# Patient Record
Sex: Female | Born: 1935 | Race: White | Hispanic: No | Smoking: Never smoker
Health system: Southern US, Community
[De-identification: ages and names within clinical notes are randomized; demographics above are authoritative.]

## PROBLEM LIST (undated history)

## (undated) DIAGNOSIS — I10 Essential (primary) hypertension: Secondary | ICD-10-CM

## (undated) DIAGNOSIS — Z5189 Encounter for other specified aftercare: Secondary | ICD-10-CM

## (undated) DIAGNOSIS — E079 Disorder of thyroid, unspecified: Secondary | ICD-10-CM

## (undated) DIAGNOSIS — I4891 Unspecified atrial fibrillation: Secondary | ICD-10-CM

## (undated) HISTORY — DX: Essential (primary) hypertension: I10

## (undated) HISTORY — DX: Disorder of thyroid, unspecified: E07.9

## (undated) HISTORY — PX: CHOLECYSTECTOMY: SHX55

## (undated) HISTORY — DX: Unspecified atrial fibrillation: I48.91

---

## 1998-11-12 ENCOUNTER — Ambulatory Visit (HOSPITAL_COMMUNITY): Admission: RE | Admit: 1998-11-12 | Discharge: 1998-11-12 | Payer: Self-pay | Admitting: Cardiology

## 1998-11-12 ENCOUNTER — Encounter: Payer: Self-pay | Admitting: Cardiology

## 1999-09-22 ENCOUNTER — Encounter (INDEPENDENT_AMBULATORY_CARE_PROVIDER_SITE_OTHER): Payer: Self-pay | Admitting: Specialist

## 1999-09-22 ENCOUNTER — Ambulatory Visit (HOSPITAL_COMMUNITY): Admission: RE | Admit: 1999-09-22 | Discharge: 1999-09-22 | Payer: Self-pay | Admitting: *Deleted

## 2000-02-06 ENCOUNTER — Emergency Department (HOSPITAL_COMMUNITY): Admission: EM | Admit: 2000-02-06 | Discharge: 2000-02-06 | Payer: Self-pay | Admitting: Emergency Medicine

## 2003-01-03 ENCOUNTER — Ambulatory Visit (HOSPITAL_COMMUNITY): Admission: RE | Admit: 2003-01-03 | Discharge: 2003-01-03 | Payer: Self-pay | Admitting: Gastroenterology

## 2004-09-29 ENCOUNTER — Inpatient Hospital Stay (HOSPITAL_COMMUNITY): Admission: AD | Admit: 2004-09-29 | Discharge: 2004-09-30 | Payer: Self-pay | Admitting: Internal Medicine

## 2004-09-30 ENCOUNTER — Encounter (INDEPENDENT_AMBULATORY_CARE_PROVIDER_SITE_OTHER): Payer: Self-pay | Admitting: Specialist

## 2005-02-15 ENCOUNTER — Encounter: Admission: RE | Admit: 2005-02-15 | Discharge: 2005-02-15 | Payer: Self-pay | Admitting: Cardiology

## 2005-06-14 ENCOUNTER — Encounter (HOSPITAL_COMMUNITY): Admission: RE | Admit: 2005-06-14 | Discharge: 2005-09-12 | Payer: Self-pay | Admitting: Cardiology

## 2005-07-05 ENCOUNTER — Ambulatory Visit: Payer: Self-pay | Admitting: Hematology & Oncology

## 2005-08-24 ENCOUNTER — Ambulatory Visit: Payer: Self-pay | Admitting: Hematology & Oncology

## 2005-11-28 ENCOUNTER — Ambulatory Visit: Payer: Self-pay | Admitting: Hematology & Oncology

## 2005-11-30 LAB — CBC & DIFF AND RETIC
BASO%: 0.5 % (ref 0.0–2.0)
EOS%: 0.1 % (ref 0.0–7.0)
HCT: 36.5 % (ref 34.8–46.6)
LYMPH%: 23.7 % (ref 14.0–48.0)
MCH: 32.3 pg (ref 26.0–34.0)
MCHC: 34.4 g/dL (ref 32.0–36.0)
MONO%: 9.6 % (ref 0.0–13.0)
NEUT%: 66.1 % (ref 39.6–76.8)
Platelets: 252 10*3/uL (ref 145–400)

## 2006-03-20 ENCOUNTER — Ambulatory Visit: Payer: Self-pay | Admitting: Hematology & Oncology

## 2006-03-22 LAB — CBC WITH DIFFERENTIAL/PLATELET
Basophils Absolute: 0 10*3/uL (ref 0.0–0.1)
EOS%: 0 % (ref 0.0–7.0)
Eosinophils Absolute: 0 10*3/uL (ref 0.0–0.5)
HGB: 13.2 g/dL (ref 11.6–15.9)
LYMPH%: 23.5 % (ref 14.0–48.0)
MCH: 32.4 pg (ref 26.0–34.0)
MCV: 95.1 fL (ref 81.0–101.0)
MONO%: 10.3 % (ref 0.0–13.0)
NEUT#: 5.4 10*3/uL (ref 1.5–6.5)
Platelets: 338 10*3/uL (ref 145–400)
RBC: 4.08 10*6/uL (ref 3.70–5.32)

## 2006-09-15 ENCOUNTER — Ambulatory Visit: Payer: Self-pay | Admitting: Hematology & Oncology

## 2006-09-20 LAB — CBC WITH DIFFERENTIAL/PLATELET
BASO%: 0.6 % (ref 0.0–2.0)
EOS%: 0.2 % (ref 0.0–7.0)
Eosinophils Absolute: 0 10*3/uL (ref 0.0–0.5)
LYMPH%: 30.8 % (ref 14.0–48.0)
MCH: 30.9 pg (ref 26.0–34.0)
MCHC: 34.4 g/dL (ref 32.0–36.0)
MCV: 89.7 fL (ref 81.0–101.0)
MONO%: 7.7 % (ref 0.0–13.0)
Platelets: 289 10*3/uL (ref 145–400)
RBC: 4.08 10*6/uL (ref 3.70–5.32)

## 2006-09-20 LAB — FERRITIN: Ferritin: 380 ng/mL — ABNORMAL HIGH (ref 10–291)

## 2007-03-08 ENCOUNTER — Ambulatory Visit: Payer: Self-pay | Admitting: Hematology & Oncology

## 2007-03-12 LAB — CBC WITH DIFFERENTIAL/PLATELET
BASO%: 0.9 % (ref 0.0–2.0)
EOS%: 0.1 % (ref 0.0–7.0)
HCT: 36.8 % (ref 34.8–46.6)
MCH: 32.8 pg (ref 26.0–34.0)
MCHC: 36 g/dL (ref 32.0–36.0)
NEUT%: 54.5 % (ref 39.6–76.8)
RBC: 4.03 10*6/uL (ref 3.70–5.32)
lymph#: 2.1 10*3/uL (ref 0.9–3.3)

## 2007-09-06 ENCOUNTER — Ambulatory Visit: Payer: Self-pay | Admitting: Hematology & Oncology

## 2007-11-09 ENCOUNTER — Encounter: Admission: RE | Admit: 2007-11-09 | Discharge: 2007-11-09 | Payer: Self-pay | Admitting: Internal Medicine

## 2010-10-22 NOTE — Op Note (Signed)
NAMEDENYLA, CORTESE NO.:  [id number]   MEDICAL RECORD NO.:  [id number]          PATIENT TYPE:  INP   LOCATION:  4731                         FACILITY:  [facility]   PHYSICIAN:  Georgiana Spinner, M.D.    DATE OF BIRTH:  01-02-1916   DATE OF PROCEDURE:  DATE OF DISCHARGE:                                 OPERATIVE REPORT   PROCEDURE:  Upper endoscopy.   INDICATIONS:  GI bleeding.   ANESTHESIA:  Demerol 50, Versed 5 mg.   PROCEDURE:  With the patient mildly sedated in the left lateral decubitus  position, the Olympus videoscopic endoscope was inserted and passed under  passed under direct vision through the esophagus which appeared normal.  The  fundus, body, and antrum, duodenal bulb, and second portion fo the duodenum  appeared normal.  From this point, the endoscope was slowly withdrawn taking  circumferential views of the duodenum.  The endoscope was then pulled back  and retroflexed taking circumferential views __________ .  Multiple erosions  were seen at the level of the hiatus.  These were photographed  A biopsy was  taken.  There were also multiple small erosions of the duodenal bulb which  were photographed only.   IMPRESSION:  No active bleeding at this point.  I did a rectal examination  which revealed that the stool was now dark brown. __________   PLAN:  Will get a __________  make sure her hemoglobin and hematocrit  remains stable.  She can be discharged with outpatient followup.      GMO/MEDQ  D:  09/30/2004  T:  09/30/2004  Job:  [id number]

## 2010-10-22 NOTE — H&P (Signed)
Cheryl Larson, Cheryl Larson NO.:  [id number]   MEDICAL RECORD NO.:  [id number]          PATIENT TYPE:  INP   LOCATION:  4731                         FACILITY:  [facility]   PHYSICIAN:  Isidor Holts, M.D.  DATE OF BIRTH:  01/01/1916   DATE OF ADMISSION:  09/29/2004  DATE OF DISCHARGE:                                HISTORY & PHYSICAL   PRIMARY CARE PHYSICIAN:  Lucas Mallow. MD.   GASTROENTEROLOGY:  Georgiana Spinner, M.D.   CHIEF COMPLAINT:  Increasing weakness/tiredness/anemia.   HISTORY OF PRESENT ILLNESS:  This is an 77 year old female, with a known  history of paroxysmal atrial fibrillation on anticoagulation therapy.  She  also has a background history of hypertension, GERD, dysthyroidism, and  dyslipidemia.  She states that she has been feeling increasingly weak,  tired, and easily fatigued over the last 1 month.  Has also had mild  shortness of breath on exertion.  Denies chest pain or ankle swelling.  Has  become increasingly light-headed intermittently, but has had no falls or  loss of consciousness.  She called her PMD on September 28, 2004, and was seen  by the P.A. on September 29, 2004.  Her hemoglobin was reportedly low (i.e.  8.8), and the patient was referred for admission.  According to the patient,  she has been having black bowel movements for approximately one month now,  but stool guaiac testing done in her PMD's office on September 29, 2004 was  negative.  The patient has had regular colonoscopies in the past.  The last  one was 2-3 years ago.   PAST MEDICAL HISTORY:  1.  Paroxysmal atrial fibrillation.  2.  Hypertension.  3.  Dyslipidemia.  4.  GERD.  5.  Hypothyroidism.  6.  Status post tonsillectomy/status post cholecystectomy/status post      cystocele and rectocele repair years ago.   MEDICATION HISTORY:  1.  Propranolol 40 mg p.o. b.i.d.  2.  Lisinopril 10 mg p.o. daily.  3.  Synthroid 88 mcg p.o. daily.  4.  Lipitor 10 mg p.o. daily.  5.  HCTZ 12.5 mg  p.o. daily.  6.  Protonix 40 mg p.o. daily.  7.  Coumadin 4 mg p.o. daily.  8.  Vicodin 5/500 one p.o. t.i.d.  9.  Vitamin C/Vitamin E/calcium.   ALLERGIES:  SULFA.   FAMILY AND SOCIAL HISTORY:  This patient is retired.  She used to work in  the Oceanographer at [employer].  Nonsmoker, nondrinker.  Has no history  of drug abuse.  She has one daughter who is status post thyroidectomy for  thyroid CA, and one adopted son.  She had two brothers, but they are now  deceased; one status post myocardial infarction, the other status post  multiple CVAs.  Her father was an alcoholic.  Her mother had emphysema,  apparently secondary to passive smoking.   PHYSICAL EXAMINATION:  VITAL SIGNS:  Temperature 97.5, pulse 64 per minute  and regular, respiratory rate 20, blood pressure 105/61 mmHg.  Pulse  oximetry 98% on room air.  GENERAL:  The patient appears quite comfortable, alert, communicative.  Not  short of breath at rest.  HEENT:  The patient is clinically pale, not jaundiced.  There is no  conjunctival injection, and throat appears quite clear.  NECK:  Supple.  JVP not seen.  There is no palpable lymphadenopathy.  CHEST:  Clear to auscultation.  No wheezes, no crackles.  HEART:  Heart sounds 1 and 2 heard.  No murmurs.  ABDOMEN:  Soft and nontender.  There is no palpable organomegaly.  No  palpable masses.  Normal bowel sounds.  LOWER EXTREMITY EXAMINATION:  No pitting edema.  Palpable peripheral pulses.  MUSCULOSKELETAL:  Not formally examined.  CENTRAL NERVOUS SYSTEM:  No focal neurologic deficit on gross examination.   INVESTIGATIONS:  INR 3.4, according to PMD.  Hemoglobin is 8.8.  Other labs  are still pending.   ASSESSMENT AND PLAN:  1.  Moderate anemia, query secondary to chronic gastrointestinal blood loss      against the background of anticoagulation.  As the patient is      symptomatic, will do iron, ferritin, TIBC, B-12, and folate levels.      Following that, will  transfuse with 2 units packed red blood cells.  We      shall also do stool guaiac testing x3, and monitor CBC.   1.  Increasing weakness/fatigue, likely secondary to #1 above.  The patient      should improve with blood transfusion.  We shall follow posttransfusion      CBC.  She will likely get a work-up, possibly as an outpatient, but will      consult Dr. Virginia Rochester, as he has been her gastroenterologist.   1.  Paroxysmal atrial fibrillation, now in sinus rhythm.  The rate is      controlled on propranolol and on Coumadin.  In view of possible GI blood      loss, will discontinue Coumadin, and follow PT INR.   1.  Hypertension, controlled.  Continue pre-admission medications, but      reduce Lisinopril to 5 mg p.o. daily as systolic blood pressure is low      normal.   1.  Dysthyroidism.  Continue preadmission Synthroid dosages and check TSH.   1.  Gastroesphageal reflux disease. Continue PPI.   1.  Dyslipidemia, on statin.  Will continue.   Further management will dependent on clinical course.      CO/MEDQ  D:  09/29/2004  T:  09/29/2004  Job:  [id number]   cc:   Georgiana Spinner, M.D.  [address]  [place]  [place] [address]  Fax: [phone]

## 2011-11-03 ENCOUNTER — Telehealth: Payer: Self-pay | Admitting: Hematology & Oncology

## 2011-11-03 ENCOUNTER — Telehealth: Payer: Self-pay | Admitting: *Deleted

## 2011-11-03 NOTE — Telephone Encounter (Signed)
Pt aware of 5-31 appointment and Tammy at referring is also aware

## 2011-11-03 NOTE — Telephone Encounter (Signed)
New Patient referral received from Dr Benedetto Goad for [facility], needing IV iron. Patient previous patient with Dr Myna Hidalgo, informed daughter, Emelda Brothers that Dr Myna Hidalgo is now at [facility] office, and she said that is fine, she would still prefer to travel to see Dr Myna Hidalgo, "she really liked him". Informed daughter that Raiford Noble will be calling to re-establish patient.  Faxed referral to satellite office, [phone].

## 2011-11-04 ENCOUNTER — Ambulatory Visit (HOSPITAL_BASED_OUTPATIENT_CLINIC_OR_DEPARTMENT_OTHER): Payer: Medicare Other

## 2011-11-04 ENCOUNTER — Other Ambulatory Visit (HOSPITAL_BASED_OUTPATIENT_CLINIC_OR_DEPARTMENT_OTHER): Payer: Medicare Other | Admitting: Lab

## 2011-11-04 ENCOUNTER — Other Ambulatory Visit: Payer: Self-pay

## 2011-11-04 ENCOUNTER — Ambulatory Visit: Payer: Self-pay

## 2011-11-04 ENCOUNTER — Ambulatory Visit (HOSPITAL_BASED_OUTPATIENT_CLINIC_OR_DEPARTMENT_OTHER): Payer: Medicare Other | Admitting: Hematology & Oncology

## 2011-11-04 ENCOUNTER — Ambulatory Visit: Payer: Medicare Other

## 2011-11-04 VITALS — BP 111/67 | HR 58 | Temp 96.9°F | Ht 64.0 in | Wt 147.0 lb

## 2011-11-04 DIAGNOSIS — D509 Iron deficiency anemia, unspecified: Secondary | ICD-10-CM

## 2011-11-04 LAB — CBC WITH DIFFERENTIAL (CANCER CENTER ONLY)
BASO%: 0.2 % (ref 0.0–2.0)
EOS%: 0.2 % (ref 0.0–7.0)
MCH: 28.2 pg (ref 26.0–34.0)
MCHC: 31.5 g/dL — ABNORMAL LOW (ref 32.0–36.0)
MONO%: 11.3 % (ref 0.0–13.0)
NEUT#: 2.5 10*3/uL (ref 1.5–6.5)
Platelets: 240 10*3/uL (ref 145–400)
RBC: 3.55 10*6/uL — ABNORMAL LOW (ref 3.70–5.32)
RDW: 13.7 % (ref 11.1–15.7)

## 2011-11-04 MED ORDER — SODIUM CHLORIDE 0.9 % IV SOLN
Freq: Once | INTRAVENOUS | Status: AC
  Administered 2011-11-04: 13:00:00 via INTRAVENOUS

## 2011-11-04 MED ORDER — SODIUM CHLORIDE 0.9 % IV SOLN
1020.0000 mg | Freq: Once | INTRAVENOUS | Status: AC
  Administered 2011-11-04: 1020 mg via INTRAVENOUS
  Filled 2011-11-04: qty 34

## 2011-11-04 NOTE — Progress Notes (Signed)
CC:   Cheryl Larson. Andrey Campanile, M.D.  DIAGNOSIS:  Anemia, iron deficiency.  HISTORY OF PRESENT ILLNESS:  Cheryl Larson is a very nice 78 year old white female.  I have not seen her now for about 5 years.  I initially saw her back in 2007/2008.  At that point in time, she was iron deficient.  We did go ahead and give her a dose of IV iron.  This obviously worked quite well for the patient.  She said it did make her feel better.  She is followed Dr. Benedetto Goad.  Over the past month or so, she has felt more tired.  She has had no obvious bleeding.  She said that Dr. Andrey Campanile did check her stools for blood.  She did do some stool guaiac cards.  She does not know the results.  She has not noticed any melena or bright red blood per rectum.  She did have lab work done by Dr. Andrey Campanile.  This was just a few weeks ago.  She was found to have normal B12 level of 247.  She had normal thyroid test.  She did have a CBC done back on 05/17 that showed a white cell count of 5, hemoglobin 9.4, hematocrit 29.8, platelet count was 245.  MCV was 93.  Ferritin was only 8.  Iron studies showed iron saturation of only 6%.  TIBC was 511.  Dr. Andrey Campanile kindly referred Cheryl Larson back to the [facility] for another dose of IV iron.  Again, she got iron back in 2007.  I think she probably got a dose of about 1800 mg.  She responded nicely to this.  The last time that we saw her, her hemoglobin was 13.3, hematocrit was 36.8.  Her ferritin.  I think was about 300.  Again, she has not had any cravings.  She is not chewing ice.  There is no dysphasia, odynophagia.  There is no melena.  She had no nausea, vomiting.  She is not a vegetarian.  There has been no weight loss or weight gain.  She has had no real change in her medications.  Her daughter wants her to get off the Zocor.  I do not see any problems with her going off Zocor at her age.  PAST MEDICAL HISTORY:  Remarkable for: 1. Iron deficiency anemia. 2.  Hyperlipidemia. 3. Hypertension. 4. Hypothyroidism. 5. Atrial fibrillation on Coumadin.  ALLERGIES:  Sulfa.  MEDICATIONS: 1. Hydrochlorothiazide 12.5 mg p.o. daily. 2. Synthroid 0.088 mg p.o. daily. 3. Inderal 60 mg p.o. b.i.d. 4. Zocor 20 mg p.o. daily. 5. Warfarin 4 mg p.o. daily.  SOCIAL HISTORY:  Negative for tobacco or alcohol use.  She has no occupational exposures.  FAMILY HISTORY:  I think noncontributory.  There is no history of "anemia" in her family.  There is very little history of cancer in the family.  REVIEW OF SYSTEMS:  As stated in history of present illness.  No additional findings noted on a 12 system review.  PHYSICAL EXAMINATION:  General:  This is an elderly but fairly well- nourished white female in no obvious distress.  Vital signs:  Showed temperature of 96.9, pulse 58, respiratory rate 20, blood pressure 111/67.  Weight is 147.  Head and neck:  Exam shows a normocephalic, atraumatic skull.  There are no ocular or oral lesions.  She has no scleral icterus.  Oral mucosa is moist.  Neck:  There is no adenopathy in her neck.  Thyroid is nonpalpable.  Lungs:  Clear to percussion and auscultation bilaterally.  She has an occasional dry crackle at the right base.  Cardiac:  Regular rate and rhythm consistent with atrial fibrillation.  She has a 1/6 systolic ejection murmur.  Abdomen:  Soft with good bowel sounds.  There is no palpable abdominal mass.  There is no fluid wave.  There is no palpable hepatosplenomegaly.  Back:  No tenderness over the spine, ribs or hips.  Extremities:  Show no clubbing, cyanosis or edema.  Neurological:  Shows no focal neurological deficits.  Skin:  Exam shows no rashes, ecchymoses or petechiae.  LABORATORY STUDIES:  White cell count is 4.8, hemoglobin 10, hematocrit 31.7, platelet count 243.  MCV of 89.  Peripheral smear shows some mild anisocytosis and poikilocytosis.  There are no target cells.  I see no nucleated red  blood cells.  There are some microcytic red cells.  I see a couple of hypochromic red cells.  She has no rouleaux formation. White cells appear normal in morphology and maturation.  There are no immature myeloid or lymphoid forms.  There are no atypical lymphocytes. I see no hypersegmented polys.  Platelets are adequate in number and size.  Platelets are well granulated.  IMPRESSION:  Cheryl Larson is a 78 year old white female with iron deficiency anemia.  By the numbers that were received by Dr. Benedetto Goad, she is iron deficient.  As such, we will go ahead and give her a dose of IV iron today.  IV Feraheme will certainly be amenable to her as this will only take about an hour.  I really do not think that a big workup needs to be undertaken for this iron deficiency.  I believe that if her stools are heme positive then she probably will need to have a colonoscopy.  I do realize that she is on Coumadin but yet there needs to be a reason for her to have GI blood loss.  I do want to get her back to see me in another 4-6 weeks.  By then the iron that we gave her should be making her feel better.  I spent a good hour with Cheryl Larson.  It was nice to see her again.  I had a good time with her and her daughter.  Cheryl Larson is very funny.    ______________________________ Josph Macho, M.D. PRE/MEDQ  D:  11/04/2011  T:  11/04/2011  Job:  [id number]

## 2011-11-04 NOTE — Patient Instructions (Signed)
Ferumoxytol injection What is this medicine? FERUMOXYTOL is an iron complex. Iron is used to make healthy red blood cells, which carry oxygen and nutrients throughout the body. This medicine is used to treat iron deficiency anemia in people with chronic kidney disease. This medicine may be used for other purposes; ask your health care provider or pharmacist if you have questions. What should I tell my health care provider before I take this medicine? They need to know if you have any of these conditions: -anemia not caused by low iron levels -high levels of iron in the blood -magnetic resonance imaging (MRI) test scheduled -an unusual or allergic reaction to iron, other medicines, foods, dyes, or preservatives -pregnant or trying to get pregnant -breast-feeding How should I use this medicine? This medicine is for infusion into a vein. It is given by a health care professional in a hospital or clinic setting. Talk to your pediatrician regarding the use of this medicine in children. Special care may be needed. Overdosage: If you think you've taken too much of this medicine contact a poison control center or emergency room at once. Overdosage: If you think you have taken too much of this medicine contact a poison control center or emergency room at once. NOTE: This medicine is only for you. Do not share this medicine with others. What if I miss a dose? It is important not to miss your dose. Call your doctor or health care professional if you are unable to keep an appointment. What may interact with this medicine? This medicine may interact with the following medications: -other iron products This list may not describe all possible interactions. Give your health care provider a list of all the medicines, herbs, non-prescription drugs, or dietary supplements you use. Also tell them if you smoke, drink alcohol, or use illegal drugs. Some items may interact with your medicine. What should I watch  for while using this medicine? Visit your doctor or healthcare professional regularly. Tell your doctor or healthcare professional if your symptoms do not start to get better or if they get worse. You may need blood work done while you are taking this medicine. You may need to follow a special diet. Talk to your doctor. Foods that contain iron include: whole grains/cereals, dried fruits, beans, or peas, leafy green vegetables, and organ meats (liver, kidney). What side effects may I notice from receiving this medicine? Side effects that you should report to your doctor or health care professional as soon as possible: -allergic reactions like skin rash, itching or hives, swelling of the face, lips, or tongue -breathing problems -changes in blood pressure -feeling faint or lightheaded, falls -fever or chills -flushing, sweating, or hot feelings -swelling of the ankles or feet Side effects that usually do not require medical attention (Report these to your doctor or health care professional if they continue or are bothersome.): -diarrhea -headache -nausea, vomiting -stomach pain This list may not describe all possible side effects. Call your doctor for medical advice about side effects. You may report side effects to FDA at 1-800-FDA-1088. Where should I keep my medicine? This drug is given in a hospital or clinic and will not be stored at home. NOTE: This sheet is a summary. It may not cover all possible information. If you have questions about this medicine, talk to your doctor, pharmacist, or health care provider.  2012, Elsevier/Gold Standard. (02/13/2008 9:48:25 PM) 

## 2011-11-04 NOTE — Progress Notes (Signed)
This office note has been dictated.

## 2011-11-05 LAB — IRON AND TIBC
%SAT: 11 % — ABNORMAL LOW (ref 20–55)
Iron: 53 ug/dL (ref 42–145)
TIBC: 476 ug/dL — ABNORMAL HIGH (ref 250–470)
UIBC: 423 ug/dL — ABNORMAL HIGH (ref 125–400)

## 2011-11-05 LAB — FERRITIN: Ferritin: 9 ng/mL — ABNORMAL LOW (ref 10–291)

## 2011-11-05 LAB — RETICULOCYTES (CHCC): ABS Retic: 65.5 10*3/uL (ref 19.0–186.0)

## 2011-12-09 ENCOUNTER — Ambulatory Visit (HOSPITAL_BASED_OUTPATIENT_CLINIC_OR_DEPARTMENT_OTHER): Payer: Medicare Other | Admitting: Hematology & Oncology

## 2011-12-09 ENCOUNTER — Other Ambulatory Visit (HOSPITAL_BASED_OUTPATIENT_CLINIC_OR_DEPARTMENT_OTHER): Payer: Medicare Other | Admitting: Lab

## 2011-12-09 VITALS — BP 153/79 | HR 86 | Temp 97.2°F | Ht 64.0 in | Wt 145.0 lb

## 2011-12-09 DIAGNOSIS — D509 Iron deficiency anemia, unspecified: Secondary | ICD-10-CM

## 2011-12-09 DIAGNOSIS — R197 Diarrhea, unspecified: Secondary | ICD-10-CM

## 2011-12-09 LAB — CBC WITH DIFFERENTIAL (CANCER CENTER ONLY)
BASO#: 0 10*3/uL (ref 0.0–0.2)
EOS%: 0 % (ref 0.0–7.0)
Eosinophils Absolute: 0 10*3/uL (ref 0.0–0.5)
HGB: 12.1 g/dL (ref 11.6–15.9)
LYMPH#: 1.8 10*3/uL (ref 0.9–3.3)
MCHC: 33.3 g/dL (ref 32.0–36.0)
MONO%: 9.1 % (ref 0.0–13.0)
NEUT#: 2.8 10*3/uL (ref 1.5–6.5)
Platelets: 202 10*3/uL (ref 145–400)
RBC: 3.93 10*6/uL (ref 3.70–5.32)

## 2011-12-09 LAB — CHCC SATELLITE - SMEAR

## 2011-12-09 LAB — IRON AND TIBC
Iron: 58 ug/dL (ref 42–145)
TIBC: 297 ug/dL (ref 250–470)
UIBC: 239 ug/dL (ref 125–400)

## 2011-12-09 NOTE — Progress Notes (Signed)
This office note has been dictated.

## 2011-12-09 NOTE — Progress Notes (Signed)
CC:   Gloriajean Dell. Andrey Campanile, M.D.  DIAGNOSIS:  Iron-deficiency anemia.  CURRENT THERAPY:  the patient is status post IV iron on 10/05/2011 with Feraheme.  INTERIM HISTORY:  Ms. Cheryl Larson comes in for followup.  She clearly was iron- deficient when we saw her.  Her iron studies showed a ferritin of only 9.  Iron saturation was 11%.  We did give her a dose of Feraheme 1020 mg on May 31st.  This has helped her quite a bit.  She is also off her Zocor.  She is not having as much diarrhea right now.  She has had no obvious bleeding.  There is no melena or bright red blood per rectum.  She has had no headache.  She has had some osteoporotic issues.  PHYSICAL EXAMINATION:  This is an elderly white female in no obvious distress.  Vital Signs:  972, pulse 86, respiratory rate 22, blood pressure 153/79.  Weight is 145.  Head and neck:  Normocephalic, atraumatic skull.  There are no ocular or oral lesions.  There are no palpable cervical or supraclavicular lymph nodes.  Lungs:  Clear bilaterally.  Cardiac:  Regular rate and rhythm with a normal S1 and S2. There are no murmurs, rubs or bruits.  Abdomen:  Soft with good bowel sounds.  There is no palpable abdominal mass.  There is no fluid wave. There is no palpable hepatosplenomegaly:  Back:  Some kyphosis.  She has no tenderness over the spine, ribs, or hips.  Extremities:  No clubbing, cyanosis or edema.  Neurological:  No focal neurological deficits.  LABORATORY STUDIES:  White cell count 5.1, hemoglobin 12.1, hematocrit 36.3, platelet count is 202.  MCV is 92.  IMPRESSION:  Ms. Cheryl Larson is a 78 year old white female with iron- deficiency anemia.  She is doing much better now.  The iron has helped her out.  We will plan to get her back in about 2- or 3 months now.  Hopefully, she will be able to hold onto her iron.    ______________________________ Josph Macho, M.D. PRE/MEDQ  D:  12/09/2011  T:  12/09/2011  Job:  [id number]

## 2011-12-15 ENCOUNTER — Telehealth: Payer: Self-pay | Admitting: Oncology

## 2011-12-15 NOTE — Telephone Encounter (Addendum)
Message copied by Lacie Draft on Thu Dec 15, 2011  9:02 AM ------      Message from: Arlan Organ R      Created: Wed Dec 14, 2011  6:18 PM       Please call and tell her that her iron is much better. Thank you. Pete 9:04 AM 12/15/2011 Called and left msg on patient's phone. Teola Bradley, Oluwatosin Bracy [employer]

## 2012-01-18 ENCOUNTER — Other Ambulatory Visit (HOSPITAL_COMMUNITY): Payer: Self-pay | Admitting: Nephrology

## 2012-01-18 DIAGNOSIS — N289 Disorder of kidney and ureter, unspecified: Secondary | ICD-10-CM

## 2012-01-20 ENCOUNTER — Ambulatory Visit (HOSPITAL_COMMUNITY)
Admission: RE | Admit: 2012-01-20 | Discharge: 2012-01-20 | Disposition: A | Discharge status: Home / Self Care | Payer: Medicare Other | Source: Ambulatory Visit | Attending: Nephrology | Admitting: Nephrology

## 2012-01-20 DIAGNOSIS — N189 Chronic kidney disease, unspecified: Secondary | ICD-10-CM | POA: Insufficient documentation

## 2012-01-20 DIAGNOSIS — N289 Disorder of kidney and ureter, unspecified: Secondary | ICD-10-CM

## 2012-03-05 ENCOUNTER — Other Ambulatory Visit (HOSPITAL_BASED_OUTPATIENT_CLINIC_OR_DEPARTMENT_OTHER): Payer: Medicare Other | Admitting: Lab

## 2012-03-05 ENCOUNTER — Ambulatory Visit (HOSPITAL_BASED_OUTPATIENT_CLINIC_OR_DEPARTMENT_OTHER): Payer: Medicare Other | Admitting: Medical

## 2012-03-05 VITALS — BP 106/65 | HR 65 | Temp 98.0°F | Resp 16 | Ht 64.0 in | Wt 145.0 lb

## 2012-03-05 DIAGNOSIS — D509 Iron deficiency anemia, unspecified: Secondary | ICD-10-CM

## 2012-03-05 LAB — CBC WITH DIFFERENTIAL (CANCER CENTER ONLY)
BASO#: 0 10*3/uL (ref 0.0–0.2)
BASO%: 0.5 % (ref 0.0–2.0)
EOS%: 0 % (ref 0.0–7.0)
HCT: 35.9 % (ref 34.8–46.6)
HGB: 12.2 g/dL (ref 11.6–15.9)
LYMPH#: 1.4 10*3/uL (ref 0.9–3.3)
LYMPH%: 34.2 % (ref 14.0–48.0)
MCH: 32.4 pg (ref 26.0–34.0)
MCHC: 34 g/dL (ref 32.0–36.0)
MONO%: 11.9 % (ref 0.0–13.0)
NEUT%: 53.4 % (ref 39.6–80.0)
RDW: 14.4 % (ref 11.1–15.7)

## 2012-03-05 LAB — IRON AND TIBC: UIBC: 253 ug/dL (ref 125–400)

## 2012-03-05 NOTE — Progress Notes (Signed)
Diagnosis: Iron deficiency anemia.  Current therapy: IV iron as indicated (last given on 11/04/2011, 1,020mg  IV Feraheme)  Interim history: Cheryl Larson presents today for an office followup visit.  Her daughter accompanies her.  Overall, Cheryl Larson, reports, that she's doing remarkably well.  She still continues to be quite active and drives.  Her self around.  Her hemoglobin looks good today at 12.2.  The last, time, she received IV iron was back on 11/04/2011.  She had a ferritin of 9, with 11% saturation.  She reports, she has fairly good energy.  She has a good appetite.  She denies any nausea, vomiting, diarrhea, or constipation.  She denies any obvious, bleeding.  There is no melena or bright red blood per thumb.  She denies any cough, chest pain, or shortness of breath.  She denies any fevers, chills, or night sweats.  She denies any headache, visual changes.  Any rashes.  Overall, for 78 years old, she does remarkably well.  Review of Systems: Pt. Denies any changes in their vision, hearing, adenopathy, fevers, chills, nausea, vomiting, diarrhea, constipation, chest pain, shortness of breath, passing blood, passing out, blacking out,  any changes in skin, joints, neurologic or psychiatric except as noted.  Physical Exam: This is a pleasant, 78 year old, elderly, white female, in no obvious distress Vitals: Temperature 90.0 degrees, pulse 65, respirations 16, blood pressure 106/65.  Weight 145 pounds HEENT reveals a normocephalic, atraumatic skull, no scleral icterus, no oral lesions  Neck is supple without any cervical or supraclavicular adenopathy.  Lungs are clear to auscultation bilaterally. There are no wheezes, rales or rhonci Cardiac is regular rate and rhythm with a normal S1 and S2. There are no murmurs, rubs, or bruits.  Abdomen is soft with good bowel sounds, there is no palpable mass. There is no palpable hepatosplenomegaly. There is no palpable fluid wave.  Musculoskeletal no  tenderness of the spine, ribs, or hips.  Extremities there are no clubbing, cyanosis, or edema.  Skin no petechia, purpura or ecchymosis Neurologic is nonfocal.  Laboratory Data: White count 4.0, hemoglobin 12.2, hematocrit 35.9, platelets 199,000  Current Outpatient Prescriptions on File Prior to Visit  Medication Sig Dispense Refill  . hydrochlorothiazide (MICROZIDE) 12.5 MG capsule every morning.       Marland Kitchen HYDROcodone-acetaminophen (VICODIN) 5-500 MG per tablet every 8 (eight) hours as needed.       Marland Kitchen levothyroxine (SYNTHROID, LEVOTHROID) 88 MCG tablet daily.       . metoprolol (LOPRESSOR) 50 MG tablet 2 (two) times daily.       Marland Kitchen trimethoprim (TRIMPEX) 100 MG tablet Nightly.       . warfarin (COUMADIN) 4 MG tablet 2 mg on Mon, Wed and Friday and 4 mg all other days.       Assessment/Plan: This is a pleasant, elderly, 78 year old, white female, with the following issues:  #1.  Iron deficiency anemia.  Overall, it appears, that Cheryl Larson has had a nice response to the IV iron.  We will continue to monitor her counts, and iron panel as well.  #2 followup we will plan to get Cheryl Larson.  Back in about 3 months, but before then should there be questions or concerns.

## 2012-06-04 ENCOUNTER — Ambulatory Visit (HOSPITAL_BASED_OUTPATIENT_CLINIC_OR_DEPARTMENT_OTHER): Payer: Medicare Other | Admitting: Hematology & Oncology

## 2012-06-04 ENCOUNTER — Other Ambulatory Visit (HOSPITAL_BASED_OUTPATIENT_CLINIC_OR_DEPARTMENT_OTHER): Payer: Medicare Other | Admitting: Lab

## 2012-06-04 VITALS — BP 158/79 | HR 62 | Temp 97.9°F | Resp 16 | Ht 64.0 in | Wt 142.0 lb

## 2012-06-04 DIAGNOSIS — D509 Iron deficiency anemia, unspecified: Secondary | ICD-10-CM

## 2012-06-04 LAB — CBC WITH DIFFERENTIAL (CANCER CENTER ONLY)
Eosinophils Absolute: 0 10*3/uL (ref 0.0–0.5)
HCT: 39.7 % (ref 34.8–46.6)
LYMPH%: 30.4 % (ref 14.0–48.0)
MCH: 31.3 pg (ref 26.0–34.0)
MCV: 95 fL (ref 81–101)
MONO#: 0.3 10*3/uL (ref 0.1–0.9)
MONO%: 8.3 % (ref 0.0–13.0)
NEUT%: 61 % (ref 39.6–80.0)
RBC: 4.19 10*6/uL (ref 3.70–5.32)
WBC: 3.8 10*3/uL — ABNORMAL LOW (ref 3.9–10.0)

## 2012-06-04 LAB — FERRITIN: Ferritin: 64 ng/mL (ref 10–291)

## 2012-06-04 LAB — IRON AND TIBC: %SAT: 19 % — ABNORMAL LOW (ref 20–55)

## 2012-06-04 NOTE — Progress Notes (Signed)
This office note has been dictated.

## 2012-06-05 NOTE — Progress Notes (Signed)
CC:   Gloriajean Dell. Andrey Campanile, M.D.  DIAGNOSIS:  Iron-deficiency anemia.  CURRENT THERAPY:  Observation.  INTERIM HISTORY:  Cheryl Larson comes in for a followup.  I last saw him back in September.  She has been doing okay.  She is still a little slow in getting around.  Of note, she did have cataract surgery for her left eye, I think 4 weeks ago.  She will have the right eye taken care of in January.  When we last saw her in September, her ferritin was 98, with an iron saturation of 24%.  She has had no problems bleeding.  She is on Coumadin.  She has had no fevers, sweats or chills.  She had a good holiday season.  She ate fairly well.  There has been no melena or bright red blood per rectum.  PHYSICAL EXAMINATION:  General:  This is an elderly white female with obvious kyphosis.  Vital signs:  Temperature 97.9, pulse 62, respiratory rate 16, blood pressure 158/79, weight is 142.  Head and neck: Normocephalic, atraumatic skull.  There are no ocular or oral lesions. There are no palpable cervical or supraclavicular lymph nodes.  Lungs: Clear bilaterally.  There are no rales, wheezes or rhonchi.  Cardiac: Regular rate and rhythm with a normal S1, S2.  There are no murmurs, rubs or bruits.  Abdomen:  Soft with good bowel sounds.  There is no palpable abdominal mass.  There is no fluid wave.  There is no palpable hepatosplenomegaly.  Extremities:  Shows osteoarthritic changes in her joints.  Back:  Marked kyphosis.  LABORATORY STUDIES:  White cell count is 3.8, hemoglobin 13.1, hematocrit 39.7, platelet count is 203.  MCV is 95.  IMPRESSION:  Cheryl Larson is a very nice 78 year old white female with iron- deficiency anemia.  Again, we pretty much corrected this.  Her MCV is 95.  Her hemoglobin continues to go up, however.  She has improved ever since we saw her back in May.  We will plan to get her back in 3 to 4 months now.  I do not see that we need any blood work in between visits.  I did  tell Ms. Kirchner to take vitamin C at 500 mg a day.  I also told her to take vitamin D 2000 units a day.   ______________________________ Josph Macho, M.D. PRE/MEDQ  D:  06/04/2012  T:  06/05/2012  Job:  [id number]

## 2012-10-03 ENCOUNTER — Ambulatory Visit: Payer: Medicare Other | Admitting: Medical

## 2012-10-03 ENCOUNTER — Other Ambulatory Visit: Payer: Medicare Other | Admitting: Lab

## 2014-02-12 LAB — PROTIME-INR

## 2014-03-12 LAB — PROTIME-INR

## 2014-03-26 LAB — PROTIME-INR

## 2014-04-09 LAB — PROTIME-INR

## 2014-04-15 ENCOUNTER — Encounter: Payer: Self-pay | Admitting: Physician Assistant

## 2014-04-21 ENCOUNTER — Ambulatory Visit: Payer: Medicare Other | Admitting: Physician Assistant

## 2014-04-24 ENCOUNTER — Telehealth: Payer: Self-pay | Admitting: Family Medicine

## 2014-04-29 NOTE — Telephone Encounter (Signed)
Patient has mcr and mcr complete b. Patient is taking metropolol, antibiotic, tramadol, warfarin, levothyroxine 19mcg. Appointment given for 1/15 at 1:00 with Evelina Dun, FNP.

## 2014-04-30 LAB — PROTIME-INR

## 2014-05-28 LAB — PROTIME-INR

## 2014-06-20 ENCOUNTER — Encounter: Payer: Self-pay | Admitting: Family

## 2014-06-20 ENCOUNTER — Encounter (INDEPENDENT_AMBULATORY_CARE_PROVIDER_SITE_OTHER): Payer: Self-pay

## 2014-06-20 ENCOUNTER — Ambulatory Visit (INDEPENDENT_AMBULATORY_CARE_PROVIDER_SITE_OTHER): Payer: Medicare Other | Admitting: Family

## 2014-06-20 VITALS — BP 130/79 | HR 88 | Temp 95.3°F | Ht 61.0 in | Wt 121.4 lb

## 2014-06-20 DIAGNOSIS — I10 Essential (primary) hypertension: Secondary | ICD-10-CM

## 2014-06-20 DIAGNOSIS — I482 Chronic atrial fibrillation, unspecified: Secondary | ICD-10-CM | POA: Insufficient documentation

## 2014-06-20 DIAGNOSIS — I4891 Unspecified atrial fibrillation: Secondary | ICD-10-CM | POA: Insufficient documentation

## 2014-06-20 DIAGNOSIS — D509 Iron deficiency anemia, unspecified: Secondary | ICD-10-CM

## 2014-06-20 DIAGNOSIS — E039 Hypothyroidism, unspecified: Secondary | ICD-10-CM

## 2014-06-20 LAB — POCT INR: INR: 4.3

## 2014-06-20 MED ORDER — TRIMETHOPRIM 100 MG PO TABS: 100.0000 mg | ORAL_TABLET | Freq: Every evening | ORAL | Status: DC

## 2014-06-20 MED ORDER — ATENOLOL 25 MG PO TABS: 25.0000 mg | ORAL_TABLET | Freq: Two times a day (BID) | ORAL | Status: DC

## 2014-06-20 MED ORDER — HYDROCHLOROTHIAZIDE 12.5 MG PO CAPS: 12.5000 mg | ORAL_CAPSULE | Freq: Every day | ORAL | Status: DC

## 2014-06-20 MED ORDER — LEVOTHYROXINE SODIUM 88 MCG PO TABS: 88.0000 ug | ORAL_TABLET | Freq: Every day | ORAL | Status: DC

## 2014-06-20 MED ORDER — WARFARIN SODIUM 5 MG PO TABS: 5.0000 mg | ORAL_TABLET | Freq: Every day | ORAL | Status: DC

## 2014-06-20 MED ORDER — TRAMADOL HCL 50 MG PO TABS: 50.0000 mg | ORAL_TABLET | Freq: Four times a day (QID) | ORAL | Status: DC | PRN

## 2014-06-20 NOTE — Progress Notes (Signed)
Subjective:    Patient ID: Cheryl Larson, female    DOB: [birth year], 81 y.o.   MRN: [id number]  Hypertension This is a chronic problem. The current episode started more than 1 year ago. The problem has been resolved since onset. The problem is controlled. Associated symptoms include palpitations. Pertinent negatives include no anxiety, blurred vision, headaches, peripheral edema or shortness of breath. Risk factors for coronary artery disease include dyslipidemia and post-menopausal state. Past treatments include beta blockers and diuretics. The current treatment provides significant improvement. Hypertensive end-organ damage includes kidney disease and a thyroid problem. There is no history of CAD/MI, CVA or heart failure.  Thyroid Problem Presents for follow-up visit. Symptoms include diarrhea and palpitations. Patient reports no constipation, depressed mood, dry skin, hair loss or leg swelling. The symptoms have been stable. Past treatments include levothyroxine. The treatment provided significant relief. There is no history of heart failure.      Review of Systems  Constitutional: Negative.   HENT: Negative.   Eyes: Negative.  Negative for blurred vision.  Respiratory: Negative.  Negative for shortness of breath.   Cardiovascular: Positive for palpitations.  Gastrointestinal: Positive for diarrhea. Negative for constipation.  Endocrine: Negative.   Genitourinary: Negative.   Musculoskeletal: Negative.   Neurological: Negative.  Negative for headaches.  Hematological: Negative.   Psychiatric/Behavioral: Negative.   All other systems reviewed and are negative.      Objective:   Physical Exam  Constitutional: She is oriented to person, place, and time. She appears well-developed and well-nourished. No distress.  HENT:  Head: Normocephalic and atraumatic.  Right Ear: External ear normal.  Left Ear: External ear normal.  Nose: Nose normal.  Mouth/Throat: Oropharynx is clear  and moist.  Eyes: Pupils are equal, round, and reactive to light.  Neck: Normal range of motion. Neck supple. No thyromegaly present.  Cardiovascular: Normal rate, regular rhythm, normal heart sounds and intact distal pulses.   No murmur heard. Pulmonary/Chest: Effort normal and breath sounds normal. No respiratory distress. She has no wheezes.  Abdominal: Soft. Bowel sounds are normal. She exhibits no distension. There is tenderness (mild rlq pain).  Musculoskeletal: Normal range of motion. She exhibits no edema or tenderness.  Neurological: She is alert and oriented to person, place, and time. She has normal reflexes. No cranial nerve deficit.  Skin: Skin is warm and dry.  Psychiatric: She has a normal mood and affect. Her behavior is normal. Judgment and thought content normal.  Vitals reviewed.    BP 130/79 mmHg  Pulse 88  Temp(Src) 95.3 F (35.2 C) (Oral)  Ht 5' 1"  (1.549 m)  Wt 121 lb 6.4 oz (55.067 kg)  BMI 22.95 kg/m2     Assessment & Plan:  1. Essential hypertension - atenolol (TENORMIN) 25 MG tablet; Take 1 tablet (25 mg total) by mouth 2 (two) times daily.  Dispense: 180 tablet; Refill: 3 - hydrochlorothiazide (MICROZIDE) 12.5 MG capsule; Take 1 capsule (12.5 mg total) by mouth daily.  Dispense: 90 capsule; Refill: 3 - CMP14+EGFR  2. Hypothyroidism, unspecified hypothyroidism type - levothyroxine (SYNTHROID, LEVOTHROID) 88 MCG tablet; Take 1 tablet (88 mcg total) by mouth daily before breakfast.  Dispense: 90 tablet; Refill: 3 - CMP14+EGFR - Thyroid Panel With TSH  3. Chronic atrial fibrillation - Ambulatory referral to Anticoagulation Monitoring - POCT INR; Standing - POCT INR - warfarin (COUMADIN) 5 MG tablet; Take 1 tablet (5 mg total) by mouth daily.  Dispense: 30 tablet; Refill: 3 - CMP14+EGFR  4. Anemia,  iron deficiency - CMP14+EGFR   Continue all meds Labs pending Health Maintenance reviewed Diet and exercise encouraged RTO 3 months  Evelina Dun, FNP

## 2014-06-20 NOTE — Patient Instructions (Addendum)
Health Maintenance Adopting a healthy lifestyle and getting preventive care can go a long way to promote health and wellness. Talk with your health care provider about what schedule of regular examinations is right for you. This is a good chance for you to check in with your provider about disease prevention and staying healthy. In between checkups, there are plenty of things you can do on your own. Experts have done a lot of research about which lifestyle changes and preventive measures are most likely to keep you healthy. Ask your health care provider for more information. WEIGHT AND DIET  Eat a healthy diet  Be sure to include plenty of vegetables, fruits, low-fat dairy products, and lean protein.  Do not eat a lot of foods high in solid fats, added sugars, or salt.  Get regular exercise. This is one of the most important things you can do for your health.  Most adults should exercise for at least 150 minutes each week. The exercise should increase your heart rate and make you sweat (moderate-intensity exercise).  Most adults should also do strengthening exercises at least twice a week. This is in addition to the moderate-intensity exercise.  Maintain a healthy weight  Body mass index (BMI) is a measurement that can be used to identify possible weight problems. It estimates body fat based on height and weight. Your health care provider can help determine your BMI and help you achieve or maintain a healthy weight.  For females 25 years of age and older:   A BMI below 18.5 is considered underweight.  A BMI of 18.5 to 24.9 is normal.  A BMI of 25 to 29.9 is considered overweight.  A BMI of 30 and above is considered obese.  Watch levels of cholesterol and blood lipids  You should start having your blood tested for lipids and cholesterol at 79 years of age, then have this test every 5 years.  You may need to have your cholesterol levels checked more often if:  Your lipid or  cholesterol levels are high.  You are older than 79 years of age.  You are at high risk for heart disease.  CANCER SCREENING   Lung Cancer  Lung cancer screening is recommended for adults 97-92 years old who are at high risk for lung cancer because of a history of smoking.  A yearly low-dose CT scan of the lungs is recommended for people who:  Currently smoke.  Have quit within the past 15 years.  Have at least a 30-pack-year history of smoking. A pack year is smoking an average of one pack of cigarettes a day for 1 year.  Yearly screening should continue until it has been 15 years since you quit.  Yearly screening should stop if you develop a health problem that would prevent you from having lung cancer treatment.  Breast Cancer  Practice breast self-awareness. This means understanding how your breasts normally appear and feel.  It also means doing regular breast self-exams. Let your health care provider know about any changes, no matter how small.  If you are in your 20s or 30s, you should have a clinical breast exam (CBE) by a health care provider every 1-3 years as part of a regular health exam.  If you are 76 or older, have a CBE every year. Also consider having a breast X-ray (mammogram) every year.  If you have a family history of breast cancer, talk to your health care provider about genetic screening.  If you are  at high risk for breast cancer, talk to your health care provider about having an MRI and a mammogram every year.  Breast cancer gene (BRCA) assessment is recommended for women who have family members with BRCA-related cancers. BRCA-related cancers include:  Breast.  Ovarian.  Tubal.  Peritoneal cancers.  Results of the assessment will determine the need for genetic counseling and BRCA1 and BRCA2 testing. Cervical Cancer Routine pelvic examinations to screen for cervical cancer are no longer recommended for nonpregnant women who are considered low  risk for cancer of the pelvic organs (ovaries, uterus, and vagina) and who do not have symptoms. A pelvic examination may be necessary if you have symptoms including those associated with pelvic infections. Ask your health care provider if a screening pelvic exam is right for you.   The Pap test is the screening test for cervical cancer for women who are considered at risk.  If you had a hysterectomy for a problem that was not cancer or a condition that could lead to cancer, then you no longer need Pap tests.  If you are older than 65 years, and you have had normal Pap tests for the past 10 years, you no longer need to have Pap tests.  If you have had past treatment for cervical cancer or a condition that could lead to cancer, you need Pap tests and screening for cancer for at least 20 years after your treatment.  If you no longer get a Pap test, assess your risk factors if they change (such as having a new sexual partner). This can affect whether you should start being screened again.  Some women have medical problems that increase their chance of getting cervical cancer. If this is the case for you, your health care provider may recommend more frequent screening and Pap tests.  The human papillomavirus (HPV) test is another test that may be used for cervical cancer screening. The HPV test looks for the virus that can cause cell changes in the cervix. The cells collected during the Pap test can be tested for HPV.  The HPV test can be used to screen women 30 years of age and older. Getting tested for HPV can extend the interval between normal Pap tests from three to five years.  An HPV test also should be used to screen women of any age who have unclear Pap test results.  After 79 years of age, women should have HPV testing as often as Pap tests.  Colorectal Cancer  This type of cancer can be detected and often prevented.  Routine colorectal cancer screening usually begins at 79 years of  age and continues through 79 years of age.  Your health care provider may recommend screening at an earlier age if you have risk factors for colon cancer.  Your health care provider may also recommend using home test kits to check for hidden blood in the stool.  A small camera at the end of a tube can be used to examine your colon directly (sigmoidoscopy or colonoscopy). This is done to check for the earliest forms of colorectal cancer.  Routine screening usually begins at age 50.  Direct examination of the colon should be repeated every 5-10 years through 79 years of age. However, you may need to be screened more often if early forms of precancerous polyps or small growths are found. Skin Cancer  Check your skin from head to toe regularly.  Tell your health care provider about any new moles or changes in   moles, especially if there is a change in a mole's shape or color.  Also tell your health care provider if you have a mole that is larger than the size of a pencil eraser.  Always use sunscreen. Apply sunscreen liberally and repeatedly throughout the day.  Protect yourself by wearing long sleeves, pants, a wide-brimmed hat, and sunglasses whenever you are outside. HEART DISEASE, DIABETES, AND HIGH BLOOD PRESSURE   Have your blood pressure checked at least every 1-2 years. High blood pressure causes heart disease and increases the risk of stroke.  If you are between 75 years and 42 years old, ask your health care provider if you should take aspirin to prevent strokes.  Have regular diabetes screenings. This involves taking a blood sample to check your fasting blood sugar level.  If you are at a normal weight and have a low risk for diabetes, have this test once every three years after 79 years of age.  If you are overweight and have a high risk for diabetes, consider being tested at a younger age or more often. PREVENTING INFECTION  Hepatitis B  If you have a higher risk for  hepatitis B, you should be screened for this virus. You are considered at high risk for hepatitis B if:  You were born in a country where hepatitis B is common. Ask your health care provider which countries are considered high risk.  Your parents were born in a high-risk country, and you have not been immunized against hepatitis B (hepatitis B vaccine).  You have HIV or AIDS.  You use needles to inject street drugs.  You live with someone who has hepatitis B.  You have had sex with someone who has hepatitis B.  You get hemodialysis treatment.  You take certain medicines for conditions, including cancer, organ transplantation, and autoimmune conditions. Hepatitis C  Blood testing is recommended for:  Everyone born from [birth year] through 1963.  Anyone with known risk factors for hepatitis C. Sexually transmitted infections (STIs)  You should be screened for sexually transmitted infections (STIs) including gonorrhea and chlamydia if:  You are sexually active and are younger than 79 years of age.  You are older than 79 years of age and your health care provider tells you that you are at risk for this type of infection.  Your sexual activity has changed since you were last screened and you are at an increased risk for chlamydia or gonorrhea. Ask your health care provider if you are at risk.  If you do not have HIV, but are at risk, it may be recommended that you take a prescription medicine daily to prevent HIV infection. This is called pre-exposure prophylaxis (PrEP). You are considered at risk if:  You are sexually active and do not regularly use condoms or know the HIV status of your partner(s).  You take drugs by injection.  You are sexually active with a partner who has HIV. Talk with your health care provider about whether you are at high risk of being infected with HIV. If you choose to begin PrEP, you should first be tested for HIV. You should then be tested every 3 months for  as long as you are taking PrEP.  PREGNANCY   If you are premenopausal and you may become pregnant, ask your health care provider about preconception counseling.  If you may become pregnant, take 400 to 800 micrograms (mcg) of folic acid every day.  If you want to prevent pregnancy, talk to your  health care provider about birth control (contraception). OSTEOPOROSIS AND MENOPAUSE   Osteoporosis is a disease in which the bones lose minerals and strength with aging. This can result in serious bone fractures. Your risk for osteoporosis can be identified using a bone density scan.  If you are 71 years of age or older, or if you are at risk for osteoporosis and fractures, ask your health care provider if you should be screened.  Ask your health care provider whether you should take a calcium or vitamin D supplement to lower your risk for osteoporosis.  Menopause may have certain physical symptoms and risks.  Hormone replacement therapy may reduce some of these symptoms and risks. Talk to your health care provider about whether hormone replacement therapy is right for you.  HOME CARE INSTRUCTIONS   Schedule regular health, dental, and eye exams.  Stay current with your immunizations.   Do not use any tobacco products including cigarettes, chewing tobacco, or electronic cigarettes.  If you are pregnant, do not drink alcohol.  If you are breastfeeding, limit how much and how often you drink alcohol.  Limit alcohol intake to no more than 1 drink per day for nonpregnant women. One drink equals 12 ounces of beer, 5 ounces of wine, or 1 ounces of hard liquor.  Do not use street drugs.  Do not share needles.  Ask your health care provider for help if you need support or information about quitting drugs.  Tell your health care provider if you often feel depressed.  Tell your health care provider if you have ever been abused or do not feel safe at home. Document Released: 12/06/2010  Document Revised: 10/07/2013 Document Reviewed: 04/24/2013 [facility] Patient Information 2015 [place], [place]. This information is not intended to replace advice given to you by your health care provider. Make sure you discuss any questions you have with your health care provider. Irritable Bowel Syndrome Irritable bowel syndrome (IBS) is caused by a disturbance of normal bowel function and is a common digestive disorder. You may also hear this condition called spastic colon, mucous colitis, and irritable colon. There is no cure for IBS. However, symptoms often gradually improve or disappear with a good diet, stress management, and medicine. This condition usually appears in late adolescence or early adulthood. Women develop it twice as often as men. CAUSES  After food has been digested and absorbed in the small intestine, waste material is moved into the large intestine, or colon. In the colon, water and salts are absorbed from the undigested products coming from the small intestine. The remaining residue, or fecal material, is held for elimination. Under normal circumstances, gentle, rhythmic contractions of the bowel walls push the fecal material along the colon toward the rectum. In IBS, however, these contractions are irregular and poorly coordinated. The fecal material is either retained too long, resulting in constipation, or expelled too soon, producing diarrhea. SIGNS AND SYMPTOMS  The most common symptom of IBS is abdominal pain. It is often in the lower left side of the abdomen, but it may occur anywhere in the abdomen. The pain comes from spasms of the bowel muscles happening too much and from the buildup of gas and fecal material in the colon. This pain:  Can range from sharp abdominal cramps to a dull, continuous ache.  Often worsens soon after eating.  Is often relieved by having a bowel movement or passing gas. Abdominal pain is usually accompanied by constipation, but it may also  produce diarrhea. The  diarrhea often occurs right after a meal or upon waking up in the morning. The stools are often soft, watery, and flecked with mucus. Other symptoms of IBS include:  Bloating.  Loss of appetite.  Heartburn.  Backache.  Dull pain in the arms or shoulders.  Nausea.  Burping.  Vomiting.  Gas. IBS may also cause symptoms that are unrelated to the digestive system, such as:  Fatigue.  Headaches.  Anxiety.  Shortness of breath.  Trouble concentrating.  Dizziness. These symptoms tend to come and go. DIAGNOSIS  The symptoms of IBS may seem like symptoms of other, more serious digestive disorders. Your health care provider may want to perform tests to exclude these disorders.  TREATMENT Many medicines are available to help correct bowel function or relieve bowel spasms and abdominal pain. Among the medicines available are:  Laxatives for severe constipation and to help restore normal bowel habits.  Specific antidiarrheal medicines to treat severe or lasting diarrhea.  Antispasmodic agents to relieve intestinal cramps. Your health care provider may also decide to treat you with a mild tranquilizer or sedative during unusually stressful periods in your life. Your health care provider may also prescribe antidepressant medicine. The use of this medicine has been shown to reduce pain and other symptoms of IBS. Remember that if any medicine is prescribed for you, you should take it exactly as directed. Make sure your health care provider knows how well it worked for you. HOME CARE INSTRUCTIONS   Take all medicines as directed by your health care provider.  Avoid foods that are high in fat or oils, such as heavy cream, butter, frankfurters, sausage, and other fatty meats.  Avoid foods that make you go to the bathroom, such as fruit, fruit juice, and dairy products.  Cut out carbonated drinks, chewing gum, and "gassy" foods such as beans and cabbage. This may  help relieve bloating and burping.  Eat foods with bran, and drink plenty of liquids with the bran foods. This helps relieve constipation.  Keep track of what foods seem to bring on your symptoms.  Avoid emotionally charged situations or circumstances that produce anxiety.  Start or continue exercising.  Get plenty of rest and sleep. Document Released: 05/23/2005 Document Revised: 05/28/2013 Document Reviewed: 01/11/2008 [facility] Patient Information 2015 [place], [place]. This information is not intended to replace advice given to you by your health care provider. Make sure you discuss any questions you have with your health care provider.  Anticoagulation Dose Instructions as of 06/20/2014      Dorene Grebe Tue Wed Thu Fri Sat   New Dose 5 mg 5 mg 5 mg 7.5 mg 5 mg 5 mg 5 mg

## 2014-06-21 LAB — THYROID PANEL WITH TSH
FREE THYROXINE INDEX: 2.5 (ref 1.2–4.9)
T3 UPTAKE RATIO: 35 % (ref 24–39)
T4, Total: 7.1 ug/dL (ref 4.5–12.0)
TSH: 1.31 u[IU]/mL (ref 0.450–4.500)

## 2014-06-21 LAB — CMP14+EGFR
ALBUMIN: 3.6 g/dL (ref 3.2–4.6)
ALT: 10 IU/L (ref 0–32)
AST: 13 IU/L (ref 0–40)
Albumin/Globulin Ratio: 1 — ABNORMAL LOW (ref 1.1–2.5)
Alkaline Phosphatase: 79 IU/L (ref 39–117)
BUN/Creatinine Ratio: 18 (ref 11–26)
BUN: 26 mg/dL (ref 10–36)
CHLORIDE: 108 mmol/L (ref 97–108)
CO2: 20 mmol/L (ref 18–29)
Calcium: 9.4 mg/dL (ref 8.7–10.3)
Creatinine, Ser: 1.48 mg/dL — ABNORMAL HIGH (ref 0.57–1.00)
GFR calc Af Amer: 34 mL/min/{1.73_m2} — ABNORMAL LOW (ref >59)
GFR calc non Af Amer: 30 mL/min/{1.73_m2} — ABNORMAL LOW (ref >59)
GLUCOSE: 121 mg/dL — AB (ref 65–99)
Globulin, Total: 3.6 g/dL (ref 1.5–4.5)
POTASSIUM: 5 mmol/L (ref 3.5–5.2)
SODIUM: 139 mmol/L (ref 134–144)
TOTAL PROTEIN: 7.2 g/dL (ref 6.0–8.5)
Total Bilirubin: <0.2 mg/dL (ref 0.0–1.2)

## 2014-06-30 ENCOUNTER — Telehealth: Payer: Self-pay | Admitting: Pharmacist

## 2014-06-30 NOTE — Telephone Encounter (Signed)
Cheryl Larson has INR scheduled for today but is unable to get here due to snow and ice.  I spoke with Cheryl Larson about when appt was rescheduled 07/07/14.  I am concerned that this might be too long to wait.  Cheryl Larson said she is not sure when she might be about to get out to bring in Cheryl Larson.  I told her if she could anytime this week I would work Cheryl Larson in.

## 2014-07-02 ENCOUNTER — Telehealth: Payer: Self-pay | Admitting: Pharmacist

## 2014-07-02 ENCOUNTER — Ambulatory Visit (INDEPENDENT_AMBULATORY_CARE_PROVIDER_SITE_OTHER): Payer: Medicare Other | Admitting: Pharmacist

## 2014-07-02 ENCOUNTER — Other Ambulatory Visit: Payer: Self-pay | Admitting: Family

## 2014-07-02 DIAGNOSIS — N390 Urinary tract infection, site not specified: Secondary | ICD-10-CM

## 2014-07-02 DIAGNOSIS — I482 Chronic atrial fibrillation, unspecified: Secondary | ICD-10-CM

## 2014-07-02 LAB — POCT URINALYSIS DIPSTICK
BILIRUBIN UA: NEGATIVE
GLUCOSE UA: NEGATIVE
Ketones, UA: NEGATIVE
Nitrite, UA: POSITIVE
Spec Grav, UA: 1.01
Urobilinogen, UA: NEGATIVE
pH, UA: 7

## 2014-07-02 LAB — POCT INR: INR: 1.9

## 2014-07-02 MED ORDER — CIPROFLOXACIN HCL 250 MG PO TABS: 250.0000 mg | ORAL_TABLET | ORAL | Status: DC

## 2014-07-02 NOTE — Patient Instructions (Signed)
Anticoagulation Dose Instructions as of 07/02/2014      Cheryl Larson Tue Wed Thu Fri Sat   New Dose 5 mg 5 mg 5 mg 7.5 mg 5 mg 5 mg 7.5 mg    Description        Increase current warfarin dose of 5mg  1 and 1/2 on wednesdays and saturdays and 1 tablet all other days.      INR was 1.9 today

## 2014-07-04 ENCOUNTER — Telehealth: Payer: Self-pay | Admitting: Pharmacist

## 2014-07-04 NOTE — Telephone Encounter (Signed)
Is pt still having diarrhea? Is it GI bug?Pt needs to make sure she is drinking lots of fluids.

## 2014-07-04 NOTE — Telephone Encounter (Signed)
Recommend change dosing of cipro to every 18 hours until she finishes cipro.

## 2014-07-04 NOTE — Telephone Encounter (Signed)
Patient was seen on 1/15 and was given HCTZ and atenolol patient daughter states that she is on metoprolol succ 50mg  1 daily. I called the pharmacy to verify and patient has been on metoprolol and not on the atenolol or HCTZ. Ok per Tammy to DC them and to restart the metoprolol. Rx for metoprolol has been called into the pharmacy and daughter aware.

## 2014-07-05 LAB — URINE CULTURE

## 2014-07-07 ENCOUNTER — Telehealth: Payer: Self-pay | Admitting: Pharmacist

## 2014-07-07 NOTE — Telephone Encounter (Signed)
Urine culture showed proteus mirabilis which was susceptible to cipro which we has prescribed at appt.  Finish course of cipro and we will recheck urine apt appt 07/21/14.  Of course if patient has any pain or burning when urinating or hematuria or is she has any mental status changes notify office ASAP for further evaluation.

## 2014-07-18 ENCOUNTER — Telehealth: Payer: Self-pay | Admitting: Hematology & Oncology

## 2014-07-18 ENCOUNTER — Encounter: Payer: Self-pay | Admitting: Family Medicine

## 2014-07-18 ENCOUNTER — Ambulatory Visit (INDEPENDENT_AMBULATORY_CARE_PROVIDER_SITE_OTHER): Payer: Medicare Other | Admitting: Family Medicine

## 2014-07-18 VITALS — BP 122/61 | HR 80 | Temp 96.5°F | Ht 61.0 in | Wt 122.0 lb

## 2014-07-18 DIAGNOSIS — R5383 Other fatigue: Secondary | ICD-10-CM

## 2014-07-18 DIAGNOSIS — I482 Chronic atrial fibrillation, unspecified: Secondary | ICD-10-CM

## 2014-07-18 LAB — POCT CBC
Granulocyte percent: 75.1 %G (ref 37–80)
HCT, POC: 27.6 % — AB (ref 37.7–47.9)
Hemoglobin: 8 g/dL — AB (ref 12.2–16.2)
Lymph, poc: 1.4 (ref 0.6–3.4)
MCH, POC: 22.7 pg — AB (ref 27–31.2)
MCHC: 29.1 g/dL — AB (ref 31.8–35.4)
MCV: 78.1 fL — AB (ref 80–97)
MPV: 6.6 fL (ref 0–99.8)
POC Granulocyte: 5.6 (ref 2–6.9)
POC LYMPH PERCENT: 18.4 %L (ref 10–50)
Platelet Count, POC: 419 10*3/uL (ref 142–424)
RBC: 3.5 M/uL — AB (ref 4.04–5.48)
RDW, POC: 16.6 %
WBC: 7.5 10*3/uL (ref 4.6–10.2)

## 2014-07-18 LAB — POCT UA - MICROSCOPIC ONLY
Casts, Ur, LPF, POC: NEGATIVE
Crystals, Ur, HPF, POC: NEGATIVE
Yeast, UA: NEGATIVE

## 2014-07-18 LAB — POCT URINALYSIS DIPSTICK
Bilirubin, UA: NEGATIVE
Glucose, UA: NEGATIVE
Ketones, UA: NEGATIVE
Nitrite, UA: NEGATIVE
Protein, UA: NEGATIVE
Spec Grav, UA: >=1.03
Urobilinogen, UA: NEGATIVE
pH, UA: 5

## 2014-07-18 LAB — POCT INR: INR: 2.6

## 2014-07-18 NOTE — Progress Notes (Signed)
Subjective:    Patient ID: Cheryl Larson, female    DOB: Jan 11, 1916, 81 y.o.   MRN: [id number]  HPI Patient c/o not feeling well.  She is on coumadin.  She has been having low appetite.  She has lower abdominal pain.  She has been having diarrhea.  She has IBS sx's according to the daughter which are chronic.  She is not eating or drinking well according to daughter.  Her PTINR on 07/02/14 was 1.9. She is on coumadin due to atrial fibrillation.  She sees GI.  She sees Heme/Onc for iron infusions. She has chronic anemia and she gets iron infusions.  According to her daughter her last hgb was 8.3 and she had an iron infusion.  Review of Systems C/o fatigue, abdominal pain, diarrhea.   No chest pain, SOB, HA, dizziness, vision change, N/V, diarrhea, constipation, dysuria, urinary urgency or frequency, myalgias, arthralgias or rash.  Objective:   Physical Exam Vital signs noted  Pale chronically ill appearing female in NAD.  HEENT - Head atraumatic Normocephalic                Eyes - PERRLA, Conjuctiva - clear Sclera- Clear EOMI                Ears - EAC's Wnl TM's Wnl Gross Hearing WNL                Nose - Nares patent                 Throat - oropharanx wnl Respiratory - Lungs CTA bilateral Cardiac - Irregular rate and rhythm. GI - Abdomen soft Nontender and bowel sounds active x 4 Extremities - No edema. Neuro - Grossly intact.  Results for orders placed or performed in visit on 07/18/14  POCT CBC  Result Value Ref Range   WBC 7.5 4.6 - 10.2 K/uL   Lymph, poc 1.4 0.6 - 3.4   POC LYMPH PERCENT 18.4 10 - 50 %L   POC Granulocyte 5.6 2 - 6.9   Granulocyte percent 75.1 37 - 80 %G   RBC 3.5 (A) 4.04 - 5.48 M/uL   Hemoglobin 8.0 (A) 12.2 - 16.2 g/dL   HCT, POC 27.6 (A) 37.7 - 47.9 %   MCV 78.1 (A) 80 - 97 fL   MCH, POC 22.7 (A) 27 - 31.2 pg   MCHC 29.1 (A) 31.8 - 35.4 g/dL   RDW, POC 16.6 %   Platelet Count, POC 419.0 142 - 424 K/uL   MPV 6.6 0 - 99.8 fL  POCT UA -  Microscopic Only  Result Value Ref Range   WBC, Ur, HPF, POC 5-10    RBC, urine, microscopic 1-3    Bacteria, U Microscopic FEW    Mucus, UA MOD    Epithelial cells, urine per micros FEW    Crystals, Ur, HPF, POC NEG    Casts, Ur, LPF, POC NEG    Yeast, UA NEG   POCT urinalysis dipstick  Result Value Ref Range   Color, UA YELLOW    Clarity, UA CLEAR    Glucose, UA NEG    Bilirubin, UA NEG    Ketones, UA NEG    Spec Grav, UA >=1.030    Blood, UA MOD    pH, UA 5.0    Protein, UA NEG    Urobilinogen, UA negative    Nitrite, UA NEG    Leukocytes, UA Trace   POCT INR  Result Value Ref  Range   INR 2.6        Assessment & Plan:  Other fatigue - Plan: POCT CBC, POCT UA - Microscopic Only, POCT urinalysis dipstick. Discussed with patient and daughter that the fatigue may be due to the chronic anemia.  She evidently doesn't tolerate PRBC's well and would continue to follow up with Hematology for tx of anemia.  Discussed getting in with GI as well and the daughter states she was just seen and they did not want to do anything.  She has been having ongoing UTI's and she has trace of leukocytes.  Would wait on culture results.  Discussed with patient to get in with Dr. Livia Snellen in the next 2 weeks for establishment. Recommend getting in with Hematology next week early and to follow up prn if feeling weaker.  Chronic atrial fibrillation - Plan: POCT INR  Anemia - follow up with Heme/Onc next week for tx of anemia.  She apparently has chronic Hgb in the 8 range according to daughter so will hold on going to hospital for transfusion since she has had difficulty with this in past.  Discussed with daughter to bring her in PRN and ED ASAP if sx's worse and then need Hematology next week early.  Poor appetite - Discussed she must eat and drink better and daughter states she does a lot better with her since she cooks and will bring her mother home for next week.  Lysbeth Penner FNP

## 2014-07-18 NOTE — Telephone Encounter (Signed)
daughter called pt needs iron per Dr. Marin Olp pt needs to come in next week lab,NP and iron infusion. She said she would call back to schedule on Monday they need to figure out transportation issues.

## 2014-07-18 NOTE — Patient Instructions (Signed)
INR as of 07/18/2014 and Previous Dosing Information    INR Dt INR Goal Madilyn Fireman Sun Mon Tue Wed Thu Fri Sat   07/18/2014 2.6 2.0-3.0 40 mg 5 mg 5 mg 5 mg 7.5 mg 5 mg 5 mg 7.5 mg    Previous description        Increase current warfarin dose of 5mg  1 and 1/2 on wednesdays and saturdays and 1 tablet all other days.    Anticoagulation Dose Instructions as of 07/18/2014      Total Sun Mon Tue Wed Thu Fri Sat   New Dose 40 mg 5 mg 5 mg 5 mg 7.5 mg 5 mg 5 mg 7.5 mg     (5 mg x 1)  (5 mg x 1)  (5 mg x 1)  (5 mg x 1.5)  (5 mg x 1)  (5 mg x 1)  (5 mg x 1.5)                         Description        Continue current dose and follow up in one month for [facility]

## 2014-07-21 ENCOUNTER — Telehealth: Payer: Self-pay | Admitting: Hematology & Oncology

## 2014-07-21 ENCOUNTER — Ambulatory Visit: Payer: Self-pay | Admitting: Family

## 2014-07-21 NOTE — Telephone Encounter (Signed)
Pt aware of 2-18 appointment

## 2014-07-22 ENCOUNTER — Telehealth: Payer: Self-pay | Admitting: Family Medicine

## 2014-07-22 NOTE — Telephone Encounter (Signed)
Left message to call back. **Spoke with Dietrich Pates, FNP and he would like to discontinue coumadin due to low hemoglobin**

## 2014-07-23 ENCOUNTER — Other Ambulatory Visit: Payer: Self-pay | Admitting: Nurse Practitioner

## 2014-07-23 DIAGNOSIS — D509 Iron deficiency anemia, unspecified: Secondary | ICD-10-CM

## 2014-07-24 ENCOUNTER — Encounter: Payer: Self-pay | Admitting: Family

## 2014-07-24 ENCOUNTER — Ambulatory Visit (HOSPITAL_BASED_OUTPATIENT_CLINIC_OR_DEPARTMENT_OTHER): Payer: Medicare Other | Admitting: Family

## 2014-07-24 ENCOUNTER — Ambulatory Visit: Payer: Medicare Other

## 2014-07-24 ENCOUNTER — Other Ambulatory Visit (HOSPITAL_BASED_OUTPATIENT_CLINIC_OR_DEPARTMENT_OTHER): Payer: Medicare Other | Admitting: Lab

## 2014-07-24 ENCOUNTER — Ambulatory Visit (HOSPITAL_COMMUNITY)
Admission: RE | Admit: 2014-07-24 | Discharge: 2014-07-24 | Disposition: A | Discharge status: Home / Self Care | Payer: Medicare Other | Source: Ambulatory Visit | Attending: Hematology & Oncology | Admitting: Hematology & Oncology

## 2014-07-24 VITALS — BP 122/66 | HR 88 | Temp 97.7°F | Resp 14 | Ht 61.0 in | Wt 121.0 lb

## 2014-07-24 DIAGNOSIS — D509 Iron deficiency anemia, unspecified: Secondary | ICD-10-CM

## 2014-07-24 DIAGNOSIS — I891 Lymphangitis: Secondary | ICD-10-CM

## 2014-07-24 DIAGNOSIS — D649 Anemia, unspecified: Secondary | ICD-10-CM | POA: Diagnosis not present

## 2014-07-24 LAB — HOLD TUBE, BLOOD BANK - CHCC SATELLITE

## 2014-07-24 LAB — CBC WITH DIFFERENTIAL (CANCER CENTER ONLY)
BASO#: 0 10*3/uL (ref 0.0–0.2)
BASO%: 0.3 % (ref 0.0–2.0)
EOS%: 0 % (ref 0.0–7.0)
Eosinophils Absolute: 0 10*3/uL (ref 0.0–0.5)
HEMATOCRIT: 25.6 % — AB (ref 34.8–46.6)
HGB: 7.5 g/dL — ABNORMAL LOW (ref 11.6–15.9)
LYMPH#: 1.1 10*3/uL (ref 0.9–3.3)
LYMPH%: 14.4 % (ref 14.0–48.0)
MCH: 24.3 pg — ABNORMAL LOW (ref 26.0–34.0)
MCHC: 29.3 g/dL — AB (ref 32.0–36.0)
MCV: 83 fL (ref 81–101)
MONO#: 0.8 10*3/uL (ref 0.1–0.9)
MONO%: 9.8 % (ref 0.0–13.0)
NEUT#: 5.9 10*3/uL (ref 1.5–6.5)
NEUT%: 75.5 % (ref 39.6–80.0)
PLATELETS: 404 10*3/uL — AB (ref 145–400)
RBC: 3.09 10*6/uL — ABNORMAL LOW (ref 3.70–5.32)
RDW: 15.7 % (ref 11.1–15.7)
WBC: 7.8 10*3/uL (ref 3.9–10.0)

## 2014-07-24 LAB — IRON AND TIBC
Iron: <10 ug/dL — ABNORMAL LOW (ref 42–145)
UIBC: 279 ug/dL (ref 125–400)

## 2014-07-24 LAB — RETICULOCYTES (CHCC)
ABS RETIC: 48.2 10*3/uL (ref 19.0–186.0)
RBC.: 3.21 MIL/uL — ABNORMAL LOW (ref 3.87–5.11)
RETIC CT PCT: 1.5 % (ref 0.4–2.3)

## 2014-07-24 LAB — FERRITIN: FERRITIN: 30 ng/mL (ref 10–291)

## 2014-07-24 NOTE — Progress Notes (Signed)
[facility]  [phone] [phone]  ID: [name] OB: September 03, 1915 MR#: [id number]#:[id number] Patient Care Team: Christain Sacramento, MD as PCP - General (Family Medicine)  DIAGNOSIS: Iron-deficiency anemia  INTERVAL HISTORY: Ms. Santmyer is here today for follow-up with complaint of fatigue and would like an iron infusion. She was last seen by Dr. Marin Olp in December 2013.  She is tired all the time and is quite "pale" today. She denies fever, chills, ice cravings, rash, cough, dizziness, headaches, SOB, chest pain, palpitations, abdominal pain, constipation, diarrhea, blood in urine or stool. No episodes of bleeding. She is on Coumadin for atrial fib but her PCP stopped that earlier this week on 16th.  No swelling, tenderness, numbness or tingling in her extremities.  Her appetite is good and she is keeping hydrated. Her weight is stable at 121 lbs.   CURRENT TREATMENT: Iron infusions as indicated.  Blood transfusions as indicated.   REVIEW OF SYSTEMS: All other 10 point review of systems is negative.   PAST MEDICAL HISTORY: Past Medical History  Diagnosis Date  . Thyroid disease   . Hypertension   . A-fib     PAST SURGICAL HISTORY: Past Surgical History  Procedure Laterality Date  . Cholecystectomy      FAMILY HISTORY No family history on file.  GYNECOLOGIC HISTORY:  No LMP recorded. Patient is postmenopausal.   SOCIAL HISTORY: History   Social History  . Marital Status: Divorced    Spouse Name: N/A  . Number of Children: N/A  . Years of Education: N/A   Occupational History  . Not on file.   Social History Main Topics  . Smoking status: Never Smoker   . Smokeless tobacco: Never Used     Comment: NEVER USED TOBACCO  . Alcohol Use: No  . Drug Use: No  . Sexual Activity: Not on file   Other Topics Concern  . Not on file   Social History Narrative    ADVANCED DIRECTIVES:  <no information>  HEALTH  MAINTENANCE: History  Substance Use Topics  . Smoking status: Never Smoker   . Smokeless tobacco: Never Used     Comment: NEVER USED TOBACCO  . Alcohol Use: No   Colonoscopy: PAP: Bone density: Lipid panel:  Allergies  Allergen Reactions  . Sulfa Antibiotics     hives    Current Outpatient Prescriptions  Medication Sig Dispense Refill  . metoprolol succinate (TOPROL-XL) 50 MG 24 hr tablet Take 50 mg by mouth daily. Take with or immediately following a meal.    . traMADol (ULTRAM) 50 MG tablet Take 1 tablet (50 mg total) by mouth every 6 (six) hours as needed. 60 tablet 1  . trimethoprim (TRIMPEX) 100 MG tablet Take 1 tablet (100 mg total) by mouth Nightly. 90 tablet 3  . warfarin (COUMADIN) 5 MG tablet Take 1 tablet (5 mg total) by mouth daily. 30 tablet 3   No current facility-administered medications for this visit.    OBJECTIVE: Filed Vitals:   07/24/14 1435  BP: 122/66  Pulse: 88  Temp: 97.7 F (36.5 C)  Resp: 14   Filed Weights   07/24/14 1435  Weight: 121 lb (54.885 kg)   ECOG FS:1 - Symptomatic but completely ambulatory Ocular: Sclerae unicteric, pupils equal, round and reactive to light Ear-nose-throat: Oropharynx clear, dentition fair Lymphatic: No cervical or supraclavicular adenopathy Lungs no rales or rhonchi, good excursion bilaterally Heart regular rate and rhythm, no murmur appreciated Abd soft, nontender, positive bowel  sounds MSK no focal spinal tenderness, no joint edema Neuro: non-focal, well-oriented, appropriate affect Breasts: Deferred  LAB RESULTS: CMP     Component Value Date/Time   NA 139 06/20/2014 1416   K 5.0 06/20/2014 1416   CL 108 06/20/2014 1416   CO2 20 06/20/2014 1416   GLUCOSE 121* 06/20/2014 1416   BUN 26 06/20/2014 1416   CREATININE 1.48* 06/20/2014 1416   CALCIUM 9.4 06/20/2014 1416   PROT 7.2 06/20/2014 1416   AST 13 06/20/2014 1416   ALT 10 06/20/2014 1416   ALKPHOS 79 06/20/2014 1416   BILITOT <0.2 06/20/2014  1416   GFRNONAA 30* 06/20/2014 1416   GFRAA 34* 06/20/2014 1416   INo results found for: SPEP, UPEP Lab Results  Component Value Date   WBC 7.8 07/24/2014   NEUTROABS 5.9 07/24/2014   HGB 7.5* 07/24/2014   HCT 25.6* 07/24/2014   MCV 83 07/24/2014   PLT 404* 07/24/2014   No results found for: LABCA2 No components found for: LABCA125  Recent Labs Lab 07/18/14 1530  INR 2.6   STUDIES: No results found.  ASSESSMENT/PLAN: Ms. Mak is a very nice 81 year old white female with iron-deficiency anemia.She was last seen in December of 2013 and her last Fereheme infusion was in May 2013.  Her Hgb today is 7.5 MCV 83. She has had no episodes of bleeding and she is off the Coumadin.  We will see what her iron studies show.  We will giver her 2 units of blood tomorrow.  We will see her back in 3 weeks for labs and follow-up.  She knows to call here with any questions or concerns and to go to the ED in the event of an emergency. We can certainly see her sooner if need be.   Eliezer Bottom, NP 07/24/2014 2:51 PM

## 2014-07-25 ENCOUNTER — Ambulatory Visit (HOSPITAL_BASED_OUTPATIENT_CLINIC_OR_DEPARTMENT_OTHER): Payer: Medicare Other

## 2014-07-25 ENCOUNTER — Encounter: Payer: Self-pay | Admitting: Hematology & Oncology

## 2014-07-25 VITALS — BP 115/64 | HR 50 | Temp 98.2°F | Resp 20

## 2014-07-25 DIAGNOSIS — D509 Iron deficiency anemia, unspecified: Secondary | ICD-10-CM

## 2014-07-25 DIAGNOSIS — D649 Anemia, unspecified: Secondary | ICD-10-CM

## 2014-07-25 LAB — PREPARE RBC (CROSSMATCH)

## 2014-07-25 MED ORDER — FUROSEMIDE 10 MG/ML IJ SOLN: 20.0000 mg | Freq: Once | INTRAMUSCULAR | Status: DC

## 2014-07-25 MED ORDER — ACETAMINOPHEN 325 MG PO TABS
650.0000 mg | ORAL_TABLET | Freq: Once | ORAL | Status: AC
  Administered 2014-07-25: 650 mg via ORAL

## 2014-07-25 MED ORDER — ACETAMINOPHEN 325 MG PO TABS
ORAL_TABLET | ORAL | Status: AC
  Filled 2014-07-25: qty 2

## 2014-07-25 MED ORDER — DIPHENHYDRAMINE HCL 25 MG PO CAPS
ORAL_CAPSULE | ORAL | Status: AC
  Filled 2014-07-25: qty 1

## 2014-07-25 MED ORDER — SODIUM CHLORIDE 0.9 % IV SOLN
510.0000 mg | Freq: Once | INTRAVENOUS | Status: AC
  Administered 2014-07-25: 510 mg via INTRAVENOUS
  Filled 2014-07-25: qty 17

## 2014-07-25 MED ORDER — DIPHENHYDRAMINE HCL 25 MG PO CAPS
25.0000 mg | ORAL_CAPSULE | Freq: Once | ORAL | Status: AC
  Administered 2014-07-25: 25 mg via ORAL

## 2014-07-25 NOTE — Patient Instructions (Addendum)
Blood Transfusion Information WHAT IS A BLOOD TRANSFUSION? A transfusion is the replacement of blood or some of its parts. Blood is made up of multiple cells which provide different functions.  Red blood cells carry oxygen and are used for blood loss replacement.  White blood cells fight against infection.  Platelets control bleeding.  Plasma helps clot blood.  Other blood products are available for specialized needs, such as hemophilia or other clotting disorders. BEFORE THE TRANSFUSION  Who gives blood for transfusions?   You may be able to donate blood to be used at a later date on yourself (autologous donation).  Relatives can be asked to donate blood. This is generally not any safer than if you have received blood from a stranger. The same precautions are taken to ensure safety when a relative's blood is donated.  Healthy volunteers who are fully evaluated to make sure their blood is safe. This is blood bank blood. Transfusion therapy is the safest it has ever been in the practice of medicine. Before blood is taken from a donor, a complete history is taken to make sure that person has no history of diseases nor engages in risky social behavior (examples are intravenous drug use or sexual activity with multiple partners). The donor's travel history is screened to minimize risk of transmitting infections, such as malaria. The donated blood is tested for signs of infectious diseases, such as HIV and hepatitis. The blood is then tested to be sure it is compatible with you in order to minimize the chance of a transfusion reaction. If you or a relative donates blood, this is often done in anticipation of surgery and is not appropriate for emergency situations. It takes many days to process the donated blood. RISKS AND COMPLICATIONS Although transfusion therapy is very safe and saves many lives, the main dangers of transfusion include:   Getting an infectious disease.  Developing a  transfusion reaction. This is an allergic reaction to something in the blood you were given. Every precaution is taken to prevent this. The decision to have a blood transfusion has been considered carefully by your caregiver before blood is given. Blood is not given unless the benefits outweigh the risks. AFTER THE TRANSFUSION  Right after receiving a blood transfusion, you will usually feel much better and more energetic. This is especially true if your red blood cells have gotten low (anemic). The transfusion raises the level of the red blood cells which carry oxygen, and this usually causes an energy increase.  The nurse administering the transfusion will monitor you carefully for complications. HOME CARE INSTRUCTIONS  No special instructions are needed after a transfusion. You may find your energy is better. Speak with your caregiver about any limitations on activity for underlying diseases you may have. SEEK MEDICAL CARE IF:   Your condition is not improving after your transfusion.  You develop redness or irritation at the intravenous (IV) site. SEEK IMMEDIATE MEDICAL CARE IF:  Any of the following symptoms occur over the next 12 hours:  Shaking chills.  You have a temperature by mouth above 102 F (38.9 C), not controlled by medicine.  Chest, back, or muscle pain.  People around you feel you are not acting correctly or are confused.  Shortness of breath or difficulty breathing.  Dizziness and fainting.  You get a rash or develop hives.  You have a decrease in urine output.  Your urine turns a dark color or changes to pink, red, or brown. Any of the following   symptoms occur over the next 10 days:  You have a temperature by mouth above 102 F (38.9 C), not controlled by medicine.  Shortness of breath.  Weakness after normal activity.  The white part of the eye turns yellow (jaundice).  You have a decrease in the amount of urine or are urinating less often.  Your  urine turns a dark color or changes to pink, red, or brown. Document Released: 05/20/2000 Document Revised: 08/15/2011 Document Reviewed: 01/07/2008 ExitCare Patient Information 2015 [employer]. This information is not intended to replace advice given to you by your health care provider. Make sure you discuss any questions you have with your health care provider.  Ferumoxytol injection What is this medicine? FERUMOXYTOL is an iron complex. Iron is used to make healthy red blood cells, which carry oxygen and nutrients throughout the body. This medicine is used to treat iron deficiency anemia in people with chronic kidney disease. This medicine may be used for other purposes; ask your health care provider or pharmacist if you have questions. COMMON BRAND NAME(S): Feraheme What should I tell my health care provider before I take this medicine? They need to know if you have any of these conditions: -anemia not caused by low iron levels -high levels of iron in the blood -magnetic resonance imaging (MRI) test scheduled -an unusual or allergic reaction to iron, other medicines, foods, dyes, or preservatives -pregnant or trying to get pregnant -breast-feeding How should I use this medicine? This medicine is for injection into a vein. It is given by a health care professional in a hospital or clinic setting. Talk to your pediatrician regarding the use of this medicine in children. Special care may be needed. Overdosage: If you think you've taken too much of this medicine contact a poison control center or emergency room at once. Overdosage: If you think you have taken too much of this medicine contact a poison control center or emergency room at once. NOTE: This medicine is only for you. Do not share this medicine with others. What if I miss a dose? It is important not to miss your dose. Call your doctor or health care professional if you are unable to keep an appointment. What may interact with  this medicine? This medicine may interact with the following medications: -other iron products This list may not describe all possible interactions. Give your health care provider a list of all the medicines, herbs, non-prescription drugs, or dietary supplements you use. Also tell them if you smoke, drink alcohol, or use illegal drugs. Some items may interact with your medicine. What should I watch for while using this medicine? Visit your doctor or healthcare professional regularly. Tell your doctor or healthcare professional if your symptoms do not start to get better or if they get worse. You may need blood work done while you are taking this medicine. You may need to follow a special diet. Talk to your doctor. Foods that contain iron include: whole grains/cereals, dried fruits, beans, or peas, leafy green vegetables, and organ meats (liver, kidney). What side effects may I notice from receiving this medicine? Side effects that you should report to your doctor or health care professional as soon as possible: -allergic reactions like skin rash, itching or hives, swelling of the face, lips, or tongue -breathing problems -changes in blood pressure -feeling faint or lightheaded, falls -fever or chills -flushing, sweating, or hot feelings -swelling of the ankles or feet Side effects that usually do not require medical attention (Report these   to your doctor or health care professional if they continue or are bothersome.): -diarrhea -headache -nausea, vomiting -stomach pain This list may not describe all possible side effects. Call your doctor for medical advice about side effects. You may report side effects to FDA at 1-800-FDA-1088. Where should I keep my medicine? This drug is given in a hospital or clinic and will not be stored at home. NOTE: This sheet is a summary. It may not cover all possible information. If you have questions about this medicine, talk to your doctor, pharmacist, or  health care provider.  2015, Elsevier/Gold Standard. (2012-01-06 15:23:36)   

## 2014-07-27 LAB — TYPE AND SCREEN
ABO/RH(D): O POS
ANTIBODY SCREEN: NEGATIVE
UNIT DIVISION: 0
Unit division: 0

## 2014-07-28 ENCOUNTER — Telehealth: Payer: Self-pay | Admitting: Family Medicine

## 2014-07-28 NOTE — Telephone Encounter (Signed)
Pt was seen and given blood and an iron infusion, they stopped her coumadin. She has a follow up appt 1st week in March. Does she still need to come to her appt with Dr.Stacks 2/29? It is to check Protime/INR

## 2014-07-28 NOTE — Telephone Encounter (Signed)
I would like to see her to determine what therapy would be appropriate for her in light of the anemia. Does she need to stay off Coumadin permanently? If so then had to we address her underlying problem that required the Coumadin in the first place. Thanks, WS.

## 2014-07-29 NOTE — Telephone Encounter (Signed)
They will keep appointment on 2/29 and f/u sooner if necessary

## 2014-08-04 ENCOUNTER — Ambulatory Visit (INDEPENDENT_AMBULATORY_CARE_PROVIDER_SITE_OTHER): Payer: Medicare Other

## 2014-08-04 ENCOUNTER — Encounter: Payer: Self-pay | Admitting: Family Medicine

## 2014-08-04 ENCOUNTER — Ambulatory Visit (INDEPENDENT_AMBULATORY_CARE_PROVIDER_SITE_OTHER): Payer: Medicare Other | Admitting: Family Medicine

## 2014-08-04 VITALS — BP 107/74 | HR 90 | Temp 96.9°F | Ht 61.0 in | Wt 116.0 lb

## 2014-08-04 DIAGNOSIS — I482 Chronic atrial fibrillation, unspecified: Secondary | ICD-10-CM

## 2014-08-04 DIAGNOSIS — R195 Other fecal abnormalities: Secondary | ICD-10-CM

## 2014-08-04 DIAGNOSIS — R300 Dysuria: Secondary | ICD-10-CM | POA: Diagnosis not present

## 2014-08-04 DIAGNOSIS — R1031 Right lower quadrant pain: Secondary | ICD-10-CM

## 2014-08-04 LAB — POCT CBC
Granulocyte percent: 79.5 %G (ref 37–80)
HEMATOCRIT: 38.5 % (ref 37.7–47.9)
Hemoglobin: 11.1 g/dL — AB (ref 12.2–16.2)
Lymph, poc: 2.2 (ref 0.6–3.4)
MCH, POC: 23.5 pg — AB (ref 27–31.2)
MCHC: 28.8 g/dL — AB (ref 31.8–35.4)
MCV: 81.5 fL (ref 80–97)
MPV: 7.8 fL (ref 0–99.8)
POC Granulocyte: 14.5 — AB (ref 2–6.9)
POC LYMPH PERCENT: 12.1 %L (ref 10–50)
Platelet Count, POC: 429 10*3/uL — AB (ref 142–424)
RBC: 4.72 M/uL (ref 4.04–5.48)
RDW, POC: 19.9 %
WBC: 18.3 10*3/uL — AB (ref 4.6–10.2)

## 2014-08-04 LAB — POCT UA - MICROSCOPIC ONLY
Bacteria, U Microscopic: NEGATIVE
CASTS, UR, LPF, POC: NEGATIVE
CRYSTALS, UR, HPF, POC: NEGATIVE
MUCUS UA: NEGATIVE
YEAST UA: NEGATIVE

## 2014-08-04 LAB — POCT URINALYSIS DIPSTICK
Bilirubin, UA: NEGATIVE
Glucose, UA: NEGATIVE
KETONES UA: NEGATIVE
Nitrite, UA: NEGATIVE
PROTEIN UA: NEGATIVE
Spec Grav, UA: <=1.005
Urobilinogen, UA: NEGATIVE
pH, UA: 8

## 2014-08-04 MED ORDER — CIPROFLOXACIN HCL 250 MG PO TABS: 250.0000 mg | ORAL_TABLET | Freq: Two times a day (BID) | ORAL | Status: DC

## 2014-08-04 MED ORDER — MEGESTROL ACETATE 400 MG/10ML PO SUSP: 400.0000 mg | Freq: Two times a day (BID) | ORAL | Status: DC

## 2014-08-04 NOTE — Progress Notes (Signed)
Subjective:  Patient ID: Cheryl Larson, female    DOB: [birth year]  Age: 81 y.o. MRN: [id number]  CC: Abdominal Pain and Urinary Tract Infection   HPI Cheryl Larson presents for increasing right lower quadrant pain for several days. No fever chills or sweats. She has a history of atrial fibrillation. She has been taking Coumadin and was found to be having on undefined source of bleeding which apparently has not been tracked down. Currently her right lower quadrant pain is a dull ache and 4-6/10 intensity. It is not associated with hematochezia or melena. She denies nausea and vomiting. She has had some dysuria and frequency but no urgency with positive urine specimen noted below. Associated symptoms include loss of appetite and approximately 15 pound weight loss since last fall with the onset of occult bleeding.  History Cheryl Larson has a past medical history of Thyroid disease; Hypertension; and A-fib.   She has past surgical history that includes Cholecystectomy.   Her family history is not on file.She reports that she has never smoked. She has never used smokeless tobacco. She reports that she does not drink alcohol or use illicit drugs.  Current Outpatient Prescriptions on File Prior to Visit  Medication Sig Dispense Refill  . levothyroxine (SYNTHROID, LEVOTHROID) 88 MCG tablet Take 88 mcg by mouth daily before breakfast.    . metoprolol succinate (TOPROL-XL) 50 MG 24 hr tablet Take 50 mg by mouth daily. Take with or immediately following a meal.    . primidone (MYSOLINE) 250 MG tablet Take 250 mg by mouth at bedtime.    . traMADol (ULTRAM) 50 MG tablet Take 1 tablet (50 mg total) by mouth every 6 (six) hours as needed. 60 tablet 1   No current facility-administered medications on file prior to visit.    ROS Review of Systems  Constitutional: Negative for fever, chills, diaphoresis, appetite change, fatigue and unexpected weight change.  HENT: Negative for congestion, ear pain,  hearing loss, postnasal drip, rhinorrhea, sneezing, sore throat and trouble swallowing.   Eyes: Negative for pain.  Respiratory: Negative for cough, chest tightness and shortness of breath.   Cardiovascular: Negative for chest pain and palpitations.  Gastrointestinal: Positive for abdominal pain (right lower quadrant). Negative for nausea, vomiting, diarrhea and constipation.  Genitourinary: Negative for dysuria, frequency and menstrual problem.  Skin: Negative for rash.  Neurological: Negative for dizziness, weakness, numbness and headaches.  Psychiatric/Behavioral: Negative for dysphoric mood and agitation.    Objective:  BP 107/74 mmHg  Pulse 90  Temp(Src) 96.9 F (36.1 C) (Oral)  Ht 5\' 1"  (1.549 m)  Wt 116 lb (52.617 kg)  BMI 21.93 kg/m2  BP Readings from Last 3 Encounters:  08/04/14 107/74  07/25/14 115/64  07/24/14 122/66    Wt Readings from Last 3 Encounters:  08/04/14 116 lb (52.617 kg)  07/24/14 121 lb (54.885 kg)  07/18/14 122 lb (55.339 kg)     Physical Exam  Constitutional: She is oriented to person, place, and time. She appears well-developed and well-nourished. No distress.  HENT:  Head: Normocephalic and atraumatic.  Right Ear: External ear normal.  Left Ear: External ear normal.  Nose: Nose normal.  Mouth/Throat: Oropharynx is clear and moist.  Eyes: Conjunctivae and EOM are normal. Pupils are equal, round, and reactive to light.  Neck: Normal range of motion. Neck supple. No thyromegaly present.  Cardiovascular: Normal rate, regular rhythm and normal heart sounds.   No murmur heard. Pulmonary/Chest: Effort normal and breath sounds normal. No respiratory  distress. She has no wheezes. She has no rales.  Abdominal: Soft. Bowel sounds are normal. She exhibits no distension. There is tenderness (moderate tenderness in the right lower quadrant with fullness but no discrete mass.).  Lymphadenopathy:    She has no cervical adenopathy.  Neurological: She is  alert and oriented to person, place, and time. She has normal reflexes.  Skin: Skin is warm and dry.  Psychiatric: She has a normal mood and affect. Her behavior is normal. Judgment and thought content normal.    No results found for: HGBA1C  Lab Results  Component Value Date   WBC 18.3* 08/04/2014   HGB 11.1* 08/04/2014   HCT 38.5 08/04/2014   PLT 404* 07/24/2014   GLUCOSE 121* 06/20/2014   ALT 10 06/20/2014   AST 13 06/20/2014   NA 139 06/20/2014   K 5.0 06/20/2014   CL 108 06/20/2014   CREATININE 1.48* 06/20/2014   BUN 26 06/20/2014   CO2 20 06/20/2014   TSH 1.310 06/20/2014   INR 2.6 07/18/2014   Results for orders placed or performed in visit on 08/04/14  POCT UA - Microscopic Only  Result Value Ref Range   WBC, Ur, HPF, POC 15-20    RBC, urine, microscopic 1-5    Bacteria, U Microscopic neg    Mucus, UA neg    Epithelial cells, urine per micros occ    Crystals, Ur, HPF, POC neg    Casts, Ur, LPF, POC neg    Yeast, UA neg   POCT urinalysis dipstick  Result Value Ref Range   Color, UA yellow    Clarity, UA clear    Glucose, UA neg    Bilirubin, UA neg    Ketones, UA neg    Spec Grav, UA <=1.005    Blood, UA trace    pH, UA 8.0    Protein, UA neg    Urobilinogen, UA negative    Nitrite, UA neg    Leukocytes, UA large (3+)   POCT CBC  Result Value Ref Range   WBC 18.3 (A) 4.6 - 10.2 K/uL   Lymph, poc 2.2 0.6 - 3.4   POC LYMPH PERCENT 12.1 10 - 50 %L   POC Granulocyte 14.5 (A) 2 - 6.9   Granulocyte percent 79.5 37 - 80 %G   RBC 4.72 4.04 - 5.48 M/uL   Hemoglobin 11.1 (A) 12.2 - 16.2 g/dL   HCT, POC 38.5 37.7 - 47.9 %   MCV 81.5 80 - 97 fL   MCH, POC 23.5 (A) 27 - 31.2 pg   MCHC 28.8 (A) 31.8 - 35.4 g/dL   RDW, POC 19.9 %   Platelet Count, POC 429 (A) 142 - 424 K/uL   MPV 7.8 0 - 99.8 fL     Assessment & Plan:   Cheryl Larson was seen today for abdominal pain and urinary tract infection.  Diagnoses and all orders for this visit:  Dysuria Orders: -      POCT UA - Microscopic Only -     POCT urinalysis dipstick -     Urine culture -     ciprofloxacin (CIPRO) 250 MG tablet; Take 1 tablet (250 mg total) by mouth 2 (two) times daily.  RLQ abdominal pain Orders: -     DG Abd 1 View -     POCT CBC -     ciprofloxacin (CIPRO) 250 MG tablet; Take 1 tablet (250 mg total) by mouth 2 (two) times daily. -  CT Abdomen Pelvis W Contrast; Future  Occult GI bleeding Orders: -     DG Abd 1 View -     POCT CBC -     CT Abdomen Pelvis W Contrast; Future -     megestrol (MEGACE) 400 MG/10ML suspension; Take 10 mLs (400 mg total) by mouth 2 (two) times daily.  Chronic atrial fibrillation   I have discontinued Ms. Coon's trimethoprim and warfarin. I am also having her start on ciprofloxacin and megestrol. Additionally, I am having her maintain her traMADol, metoprolol succinate, levothyroxine, and primidone.    Follow-up: Return in about 1 week (around 08/11/2014).  Claretta Fraise, M.D.

## 2014-08-05 LAB — URINE CULTURE

## 2014-08-06 ENCOUNTER — Telehealth: Payer: Self-pay | Admitting: Family Medicine

## 2014-08-08 ENCOUNTER — Ambulatory Visit (HOSPITAL_COMMUNITY): Payer: Medicare Other

## 2014-08-11 ENCOUNTER — Other Ambulatory Visit (HOSPITAL_COMMUNITY): Payer: Self-pay

## 2014-08-11 ENCOUNTER — Ambulatory Visit (HOSPITAL_COMMUNITY)
Admission: RE | Admit: 2014-08-11 | Discharge: 2014-08-11 | Disposition: A | Discharge status: Home / Self Care | Payer: Medicare Other | Source: Ambulatory Visit | Attending: Family Medicine | Admitting: Family Medicine

## 2014-08-11 ENCOUNTER — Other Ambulatory Visit: Payer: Self-pay

## 2014-08-11 ENCOUNTER — Encounter (HOSPITAL_COMMUNITY): Payer: Self-pay | Admitting: *Deleted

## 2014-08-11 ENCOUNTER — Inpatient Hospital Stay (HOSPITAL_COMMUNITY)
Admission: EM | Admit: 2014-08-11 | Discharge: 2014-08-25 | DRG: 329 | Disposition: A | Discharge status: Skilled Nursing Facility | Payer: Medicare Other | Attending: Internal Medicine | Admitting: Internal Medicine

## 2014-08-11 ENCOUNTER — Emergency Department (HOSPITAL_COMMUNITY): Payer: Medicare Other

## 2014-08-11 DIAGNOSIS — R0600 Dyspnea, unspecified: Secondary | ICD-10-CM

## 2014-08-11 DIAGNOSIS — R195 Other fecal abnormalities: Secondary | ICD-10-CM

## 2014-08-11 DIAGNOSIS — R1900 Intra-abdominal and pelvic swelling, mass and lump, unspecified site: Secondary | ICD-10-CM

## 2014-08-11 DIAGNOSIS — E039 Hypothyroidism, unspecified: Secondary | ICD-10-CM | POA: Diagnosis present

## 2014-08-11 DIAGNOSIS — R627 Adult failure to thrive: Secondary | ICD-10-CM | POA: Diagnosis present

## 2014-08-11 DIAGNOSIS — R1031 Right lower quadrant pain: Secondary | ICD-10-CM

## 2014-08-11 DIAGNOSIS — Z881 Allergy status to other antibiotic agents status: Secondary | ICD-10-CM

## 2014-08-11 DIAGNOSIS — K9189 Other postprocedural complications and disorders of digestive system: Secondary | ICD-10-CM

## 2014-08-11 DIAGNOSIS — D72829 Elevated white blood cell count, unspecified: Secondary | ICD-10-CM

## 2014-08-11 DIAGNOSIS — D509 Iron deficiency anemia, unspecified: Secondary | ICD-10-CM | POA: Diagnosis present

## 2014-08-11 DIAGNOSIS — C189 Malignant neoplasm of colon, unspecified: Principal | ICD-10-CM | POA: Diagnosis present

## 2014-08-11 DIAGNOSIS — R1110 Vomiting, unspecified: Secondary | ICD-10-CM

## 2014-08-11 DIAGNOSIS — K529 Noninfective gastroenteritis and colitis, unspecified: Secondary | ICD-10-CM | POA: Diagnosis not present

## 2014-08-11 DIAGNOSIS — R1901 Right upper quadrant abdominal swelling, mass and lump: Secondary | ICD-10-CM | POA: Diagnosis present

## 2014-08-11 DIAGNOSIS — I482 Chronic atrial fibrillation, unspecified: Secondary | ICD-10-CM | POA: Diagnosis present

## 2014-08-11 DIAGNOSIS — J189 Pneumonia, unspecified organism: Secondary | ICD-10-CM | POA: Diagnosis not present

## 2014-08-11 DIAGNOSIS — D638 Anemia in other chronic diseases classified elsewhere: Secondary | ICD-10-CM | POA: Diagnosis present

## 2014-08-11 DIAGNOSIS — K6389 Other specified diseases of intestine: Secondary | ICD-10-CM | POA: Diagnosis present

## 2014-08-11 DIAGNOSIS — D735 Infarction of spleen: Secondary | ICD-10-CM | POA: Diagnosis present

## 2014-08-11 DIAGNOSIS — R109 Unspecified abdominal pain: Secondary | ICD-10-CM

## 2014-08-11 DIAGNOSIS — I10 Essential (primary) hypertension: Secondary | ICD-10-CM | POA: Diagnosis present

## 2014-08-11 DIAGNOSIS — E876 Hypokalemia: Secondary | ICD-10-CM | POA: Diagnosis not present

## 2014-08-11 DIAGNOSIS — Z79899 Other long term (current) drug therapy: Secondary | ICD-10-CM

## 2014-08-11 DIAGNOSIS — N179 Acute kidney failure, unspecified: Secondary | ICD-10-CM | POA: Diagnosis not present

## 2014-08-11 DIAGNOSIS — K567 Ileus, unspecified: Secondary | ICD-10-CM

## 2014-08-11 DIAGNOSIS — Y95 Nosocomial condition: Secondary | ICD-10-CM | POA: Diagnosis not present

## 2014-08-11 DIAGNOSIS — K56600 Partial intestinal obstruction, unspecified as to cause: Secondary | ICD-10-CM | POA: Diagnosis present

## 2014-08-11 HISTORY — DX: Encounter for other specified aftercare: Z51.89

## 2014-08-11 LAB — CBC WITH DIFFERENTIAL/PLATELET
Basophils Absolute: 0 10*3/uL (ref 0.0–0.1)
Basophils Relative: 0 % (ref 0–1)
Eosinophils Absolute: 0 10*3/uL (ref 0.0–0.7)
Eosinophils Relative: 0 % (ref 0–5)
HEMATOCRIT: 32.5 % — AB (ref 36.0–46.0)
HEMOGLOBIN: 10.1 g/dL — AB (ref 12.0–15.0)
LYMPHS ABS: 2 10*3/uL (ref 0.7–4.0)
LYMPHS PCT: 14 % (ref 12–46)
MCH: 25.9 pg — ABNORMAL LOW (ref 26.0–34.0)
MCHC: 31.1 g/dL (ref 30.0–36.0)
MCV: 83.3 fL (ref 78.0–100.0)
MONO ABS: 1.8 10*3/uL — AB (ref 0.1–1.0)
Monocytes Relative: 12 % (ref 3–12)
Neutro Abs: 10.6 10*3/uL — ABNORMAL HIGH (ref 1.7–7.7)
Neutrophils Relative %: 74 % (ref 43–77)
Platelets: 360 10*3/uL (ref 150–400)
RBC: 3.9 MIL/uL (ref 3.87–5.11)
RDW: 19.2 % — ABNORMAL HIGH (ref 11.5–15.5)
WBC: 14.5 10*3/uL — ABNORMAL HIGH (ref 4.0–10.5)

## 2014-08-11 LAB — COMPREHENSIVE METABOLIC PANEL
ALBUMIN: 2.7 g/dL — AB (ref 3.5–5.2)
ALK PHOS: 90 U/L (ref 39–117)
ALT: 11 U/L (ref 0–35)
ANION GAP: 9 (ref 5–15)
AST: 19 U/L (ref 0–37)
BILIRUBIN TOTAL: 0.4 mg/dL (ref 0.3–1.2)
BUN: 23 mg/dL (ref 6–23)
CHLORIDE: 100 mmol/L (ref 96–112)
CO2: 24 mmol/L (ref 19–32)
CREATININE: 0.95 mg/dL (ref 0.50–1.10)
Calcium: 9.6 mg/dL (ref 8.4–10.5)
GFR, EST AFRICAN AMERICAN: 57 mL/min — AB (ref >90)
GFR, EST NON AFRICAN AMERICAN: 49 mL/min — AB (ref >90)
GLUCOSE: 114 mg/dL — AB (ref 70–99)
Potassium: 4.1 mmol/L (ref 3.5–5.1)
Sodium: 133 mmol/L — ABNORMAL LOW (ref 135–145)
Total Protein: 7.6 g/dL (ref 6.0–8.3)

## 2014-08-11 LAB — LIPASE, BLOOD: Lipase: 12 U/L (ref 11–59)

## 2014-08-11 LAB — POCT I-STAT CREATININE: Creatinine, Ser: 1 mg/dL (ref 0.50–1.10)

## 2014-08-11 MED ORDER — SODIUM CHLORIDE 0.9 % IJ SOLN
INTRAMUSCULAR | Status: AC
  Filled 2014-08-11: qty 30

## 2014-08-11 MED ORDER — SODIUM CHLORIDE 0.9 % IJ SOLN
INTRAMUSCULAR | Status: AC
  Filled 2014-08-11: qty 500

## 2014-08-11 MED ORDER — SODIUM CHLORIDE 0.9 % IV SOLN
INTRAVENOUS | Status: DC
  Administered 2014-08-11: 23:00:00 via INTRAVENOUS

## 2014-08-11 MED ORDER — SODIUM CHLORIDE 0.9 % IV SOLN
INTRAVENOUS | Status: DC
  Administered 2014-08-12: via INTRAVENOUS

## 2014-08-11 MED ORDER — IOHEXOL 300 MG/ML  SOLN
100.0000 mL | Freq: Once | INTRAMUSCULAR | Status: AC | PRN
  Administered 2014-08-11: 100 mL via INTRAVENOUS

## 2014-08-11 MED ORDER — SODIUM CHLORIDE 0.9 % IV SOLN
INTRAVENOUS | Status: AC
  Filled 2014-08-11: qty 2500

## 2014-08-11 NOTE — ED Provider Notes (Addendum)
CSN: [id number]     Arrival date & time 08/11/14  1633 History  This chart was scribed for Cheryl Sorrow, MD by Molli Posey, ED Scribe. This patient was seen in room APA03/AP[facility]03 and the patient's care was started 10:12 PM.    Chief Complaint  Patient presents with  . Abdominal Pain    Patient is a 81 y.o. female presenting with abdominal pain. The history is provided by the patient and a relative. No language interpreter was used.  Abdominal Pain Pain location:  RLQ Pain radiates to:  Does not radiate Pain severity:  Moderate Timing:  Intermittent Progression:  Unable to specify Chronicity:  Recurrent Associated symptoms: constipation, diarrhea and shortness of breath   Associated symptoms: no chest pain, no chills, no cough, no dysuria, no fever, no sore throat and no vomiting    HPI Comments: Cheryl Larson is a 81 y.o. female with a history of thyroid disease, HTN and A-fib who presents to the Emergency Department complaining of abdominal pain in her RLQ since October of 2015. Pt reports associated loss off appetite and episodes of intermittent diarrhea and constipation. Daughter states that pt recently diagnosed with abdominal tumor. Daughter states that pt has needed 3 blood transfusions in the past. She states that pt was recently taken off medication. She denies vomiting.   Past Medical History  Diagnosis Date  . Thyroid disease   . Hypertension   . A-fib   . Blood transfusion without reported diagnosis    Past Surgical History  Procedure Laterality Date  . Cholecystectomy     History reviewed. No pertinent family history. History  Substance Use Topics  . Smoking status: Never Smoker   . Smokeless tobacco: Never Used     Comment: NEVER USED TOBACCO  . Alcohol Use: No   OB History    No data available     Review of Systems  Constitutional: Positive for appetite change and unexpected weight change. Negative for fever and chills.  HENT: Positive for rhinorrhea.  Negative for sore throat.   Eyes: Negative for visual disturbance.  Respiratory: Positive for shortness of breath. Negative for cough.   Cardiovascular: Negative for chest pain and leg swelling.  Gastrointestinal: Positive for abdominal pain, diarrhea and constipation. Negative for vomiting.  Genitourinary: Positive for frequency. Negative for dysuria.  Musculoskeletal: Positive for back pain. Negative for neck pain.  Skin: Negative for rash.  Neurological: Negative for headaches.  Hematological: Does not bruise/bleed easily.  Psychiatric/Behavioral: Negative for confusion.      Allergies  Sulfa antibiotics  Home Medications   Prior to Admission medications   Medication Sig Start Date End Date Taking? Authorizing Provider  Cholecalciferol 2000 UNITS TABS Take 1 tablet by mouth every morning.   Yes Historical Provider, MD  ciprofloxacin (CIPRO) 250 MG tablet Take 1 tablet (250 mg total) by mouth 2 (two) times daily. 08/04/14  Yes Claretta Fraise, MD  levothyroxine (SYNTHROID, LEVOTHROID) 88 MCG tablet Take 88 mcg by mouth daily before breakfast.   Yes Historical Provider, MD  megestrol (MEGACE) 400 MG/10ML suspension Take 10 mLs (400 mg total) by mouth 2 (two) times daily. 08/04/14  Yes Claretta Fraise, MD  metoprolol succinate (TOPROL-XL) 50 MG 24 hr tablet Take 50 mg by mouth daily. Take with or immediately following a meal.   Yes Historical Provider, MD  primidone (MYSOLINE) 250 MG tablet Take 125 mg by mouth at bedtime.    Yes Historical Provider, MD  traMADol (ULTRAM) 50 MG tablet Take  1 tablet (50 mg total) by mouth every 6 (six) hours as needed. 06/20/14  Yes Christy A Hawks, FNP   BP 114/64 mmHg  Pulse 73  Temp(Src) 98.7 F (37.1 C) (Oral)  Resp 16  Ht 5\' 2"  (1.575 m)  Wt 116 lb (52.617 kg)  BMI 21.21 kg/m2  SpO2 96% Physical Exam  Constitutional: She is oriented to person, place, and time. She appears well-developed and well-nourished.  HENT:  Head: Normocephalic and  atraumatic.  Eyes: EOM are normal. Pupils are equal, round, and reactive to light. Right eye exhibits no discharge. Left eye exhibits no discharge. No scleral icterus.  Neck: Neck supple. No tracheal deviation present.  Cardiovascular: Normal rate.   Pulmonary/Chest: Effort normal. No respiratory distress.  Abdominal: She exhibits distension and mass. There is tenderness.  Abdominal mas RUQ that is tender.  Musculoskeletal: She exhibits no edema.  Neurological: She is alert and oriented to person, place, and time.  Skin: Skin is warm and dry.  Psychiatric: She has a normal mood and affect.  Nursing note and vitals reviewed.   ED Course  Procedures   DIAGNOSTIC STUDIES: Oxygen Saturation is 96% on RA, normal by my interpretation.    COORDINATION OF CARE: 10:23 PM Discussed treatment plan with pt at bedside and pt agreed to plan.   Labs Review Labs Reviewed  CBC WITH DIFFERENTIAL/PLATELET - Abnormal; Notable for the following:    WBC 14.5 (*)    Hemoglobin 10.1 (*)    HCT 32.5 (*)    MCH 25.9 (*)    RDW 19.2 (*)    Neutro Abs 10.6 (*)    Monocytes Absolute 1.8 (*)    All other components within normal limits  COMPREHENSIVE METABOLIC PANEL - Abnormal; Notable for the following:    Sodium 133 (*)    Glucose, Bld 114 (*)    Albumin 2.7 (*)    GFR calc non Af Amer 49 (*)    GFR calc Af Amer 57 (*)    All other components within normal limits  LIPASE, BLOOD  URINALYSIS, ROUTINE W REFLEX MICROSCOPIC   Results for orders placed or performed during the hospital encounter of 08/11/14  CBC with Differential  Result Value Ref Range   WBC 14.5 (H) 4.0 - 10.5 K/uL   RBC 3.90 3.87 - 5.11 MIL/uL   Hemoglobin 10.1 (L) 12.0 - 15.0 g/dL   HCT 32.5 (L) 36.0 - 46.0 %   MCV 83.3 78.0 - 100.0 fL   MCH 25.9 (L) 26.0 - 34.0 pg   MCHC 31.1 30.0 - 36.0 g/dL   RDW 19.2 (H) 11.5 - 15.5 %   Platelets 360 150 - 400 K/uL   Neutrophils Relative % 74 43 - 77 %   Neutro Abs 10.6 (H) 1.7 - 7.7  K/uL   Lymphocytes Relative 14 12 - 46 %   Lymphs Abs 2.0 0.7 - 4.0 K/uL   Monocytes Relative 12 3 - 12 %   Monocytes Absolute 1.8 (H) 0.1 - 1.0 K/uL   Eosinophils Relative 0 0 - 5 %   Eosinophils Absolute 0.0 0.0 - 0.7 K/uL   Basophils Relative 0 0 - 1 %   Basophils Absolute 0.0 0.0 - 0.1 K/uL  Comprehensive metabolic panel  Result Value Ref Range   Sodium 133 (L) 135 - 145 mmol/L   Potassium 4.1 3.5 - 5.1 mmol/L   Chloride 100 96 - 112 mmol/L   CO2 24 19 - 32 mmol/L   Glucose, Bld  114 (H) 70 - 99 mg/dL   BUN 23 6 - 23 mg/dL   Creatinine, Ser 0.95 0.50 - 1.10 mg/dL   Calcium 9.6 8.4 - 10.5 mg/dL   Total Protein 7.6 6.0 - 8.3 g/dL   Albumin 2.7 (L) 3.5 - 5.2 g/dL   AST 19 0 - 37 U/L   ALT 11 0 - 35 U/L   Alkaline Phosphatase 90 39 - 117 U/L   Total Bilirubin 0.4 0.3 - 1.2 mg/dL   GFR calc non Af Amer 49 (L) >90 mL/min   GFR calc Af Amer 57 (L) >90 mL/min   Anion gap 9 5 - 15  Lipase, blood  Result Value Ref Range   Lipase 12 11 - 59 U/L     Imaging Review Ct Abdomen Pelvis W Contrast  08/11/2014   CLINICAL DATA:  RIGHT lower quadrant abdominal pain, feels like she has a mass in that area, 60 pound weight loss over 3 years, occult GI bleeding, hypertension, atrial fibrillation  EXAM: CT ABDOMEN AND PELVIS WITH CONTRAST  TECHNIQUE: Multidetector CT imaging of the abdomen and pelvis was performed using the standard protocol following bolus administration of intravenous contrast. Sagittal and coronal MPR images reconstructed from axial data set.  CONTRAST:  165mL OMNIPAQUE IOHEXOL 300 MG/ML SOLN IV. Dilute oral contrast.  COMPARISON:  None  FINDINGS: Small bibasilar pleural effusions and atelectasis.  Large area of absent enhancement within the spleen consistent with a large splenic infarct.  No splenic vein is identified though perisplenic collaterals are seen.  Small splenule adjacent to splenic hilum.  Atrophic pancreas with CBD dilatation and prior cholecystectomy.  Liver, kidneys,  and adrenal glands normal.  Large mass at hepatic flexure of colon measuring 9.6 x 6.3 x 6.3 cm in size consistent with a colonic neoplasm.  Mild infiltrative changes of the fat surrounding the mass are seen which could be related to edema, ascites, or tumor extension.  Mass obstructs the RIGHT colon, with dilatation of the ascending colon and cecum as well as of multiple small bowel loops.  Transverse colon decompressed.  Diverticulosis of descending and sigmoid colon without evidence of diverticulitis.  Moderate sized hiatal hernia.  Remainder of stomach unremarkable.  Atherosclerotic changes at aorta and coronary arteries.  No additional mass, adenopathy, or free air.  Minimal perihepatic and pelvic ascites.  Bones demineralized with scattered degenerative disc and facet disease changes of the thoracolumbar spine.  IMPRESSION: Large mass at hepatic flexure of colon measuring 9.6 x 6.3 x 6.3 cm in size consistent with a colonic neoplasm, causing colonic and small bowel obstruction.  Minimal peritumoral infiltration could be related to edema/ascites though tumor extension is not excluded.  Minimal ascites.  Atrophic pancreas.  Additionally, large splenic infarct with nonvisualization of the splenic vein and note of scattered collaterals in the perisplenic region.  Findings called to [employer] on 08/11/2014 at 1613 hours.  Findings also discussed with patient and her daughter.   Electronically Signed   By: Lavonia Dana M.D.   On: 08/11/2014 16:37   Dg Chest Port 1 View  08/11/2014   CLINICAL DATA:  Low abdominal pain since October 2015, recent diagnosis of abdominal tumor. Loss of appetite, intermittent diarrhea and constipation. Weakness.  EXAM: PORTABLE CHEST - 1 VIEW  COMPARISON:  None.  FINDINGS: The cardiac silhouette appears mild to moderately enlarged. Mediastinal silhouette is nonsuspicious. Large hiatal hernia. Bold bibasilar strandy densities, mildly elevated LEFT hemidiaphragm. Skin folds project in  LEFT chest. No pleural  effusion or focal consolidation. Gaseous distended esophagus. Soft tissue planes and included osseous structures are nonsuspicious.  IMPRESSION: Mild to moderate cardiomegaly.  Bibasilar atelectasis.  Large hiatal hernia.   Electronically Signed   By: Elon Alas   On: 08/11/2014 23:17     EKG Interpretation None        Date: 08/11/2014  Rate:76  Rhythm: atrial fibrillation  QRS Axis: left  Intervals: normal  ST/T Wave abnormalities: nonspecific ST/T changes  Conduction Disutrbances:left anterior fascicular block  Narrative Interpretation:   Old EKG Reviewed: none available    MDM   Final diagnoses:  Abdominal pain  Abdominal mass   Colonic mass by CT scan obstruction clinically more partial obstruction. Most likely neoplasm. Symptoms been ongoing since October. Bouts of diarrhea than constipation no vomiting. Patient referred in by primary care physician based on the CT scan results. Admission and consultation with hematology oncology would be appropriate.  I personally performed the services described in this documentation, which was scribed in my presence. The recorded information has been reviewed and is accurate.       Cheryl Sorrow, MD 08/11/14 [id number]  Cheryl Sorrow, MD 08/11/14 [phone]

## 2014-08-11 NOTE — ED Notes (Signed)
MD at bedside. 

## 2014-08-11 NOTE — ED Notes (Signed)
Pain RLQ, since October, Had ct done today,and sent to ED   No vomiting.  Problem with loss of appetite, diarrehea , then contipation

## 2014-08-12 ENCOUNTER — Encounter (HOSPITAL_COMMUNITY): Payer: Self-pay | Admitting: Gastroenterology

## 2014-08-12 ENCOUNTER — Telehealth: Payer: Self-pay | Admitting: Gastroenterology

## 2014-08-12 DIAGNOSIS — J189 Pneumonia, unspecified organism: Secondary | ICD-10-CM | POA: Diagnosis not present

## 2014-08-12 DIAGNOSIS — K529 Noninfective gastroenteritis and colitis, unspecified: Secondary | ICD-10-CM | POA: Diagnosis not present

## 2014-08-12 DIAGNOSIS — E039 Hypothyroidism, unspecified: Secondary | ICD-10-CM | POA: Diagnosis present

## 2014-08-12 DIAGNOSIS — C189 Malignant neoplasm of colon, unspecified: Secondary | ICD-10-CM | POA: Diagnosis present

## 2014-08-12 DIAGNOSIS — D638 Anemia in other chronic diseases classified elsewhere: Secondary | ICD-10-CM | POA: Diagnosis present

## 2014-08-12 DIAGNOSIS — R627 Adult failure to thrive: Secondary | ICD-10-CM | POA: Diagnosis present

## 2014-08-12 DIAGNOSIS — K567 Ileus, unspecified: Secondary | ICD-10-CM | POA: Diagnosis not present

## 2014-08-12 DIAGNOSIS — I10 Essential (primary) hypertension: Secondary | ICD-10-CM

## 2014-08-12 DIAGNOSIS — Z881 Allergy status to other antibiotic agents status: Secondary | ICD-10-CM | POA: Diagnosis not present

## 2014-08-12 DIAGNOSIS — K5669 Other intestinal obstruction: Secondary | ICD-10-CM

## 2014-08-12 DIAGNOSIS — Y95 Nosocomial condition: Secondary | ICD-10-CM | POA: Diagnosis not present

## 2014-08-12 DIAGNOSIS — R1900 Intra-abdominal and pelvic swelling, mass and lump, unspecified site: Secondary | ICD-10-CM | POA: Diagnosis present

## 2014-08-12 DIAGNOSIS — E876 Hypokalemia: Secondary | ICD-10-CM | POA: Diagnosis not present

## 2014-08-12 DIAGNOSIS — I482 Chronic atrial fibrillation: Secondary | ICD-10-CM | POA: Diagnosis present

## 2014-08-12 DIAGNOSIS — D509 Iron deficiency anemia, unspecified: Secondary | ICD-10-CM | POA: Diagnosis present

## 2014-08-12 DIAGNOSIS — D735 Infarction of spleen: Secondary | ICD-10-CM | POA: Diagnosis present

## 2014-08-12 DIAGNOSIS — N179 Acute kidney failure, unspecified: Secondary | ICD-10-CM | POA: Diagnosis not present

## 2014-08-12 DIAGNOSIS — K6389 Other specified diseases of intestine: Secondary | ICD-10-CM

## 2014-08-12 DIAGNOSIS — Z79899 Other long term (current) drug therapy: Secondary | ICD-10-CM | POA: Diagnosis not present

## 2014-08-12 LAB — URINALYSIS, ROUTINE W REFLEX MICROSCOPIC
Bilirubin Urine: NEGATIVE
GLUCOSE, UA: NEGATIVE mg/dL
Hgb urine dipstick: NEGATIVE
Ketones, ur: NEGATIVE mg/dL
LEUKOCYTES UA: NEGATIVE
NITRITE: NEGATIVE
PH: 5 (ref 5.0–8.0)
Protein, ur: NEGATIVE mg/dL
Specific Gravity, Urine: 1.01 (ref 1.005–1.030)
Urobilinogen, UA: 0.2 mg/dL (ref 0.0–1.0)

## 2014-08-12 MED ORDER — ONDANSETRON HCL 4 MG PO TABS: 4.0000 mg | ORAL_TABLET | Freq: Four times a day (QID) | ORAL | Status: DC | PRN

## 2014-08-12 MED ORDER — SODIUM CHLORIDE 0.9 % IJ SOLN: 3.0000 mL | INTRAMUSCULAR | Status: DC | PRN

## 2014-08-12 MED ORDER — SODIUM CHLORIDE 0.9 % IJ SOLN
3.0000 mL | Freq: Two times a day (BID) | INTRAMUSCULAR | Status: DC
  Administered 2014-08-13 – 2014-08-25 (×12): 3 mL via INTRAVENOUS

## 2014-08-12 MED ORDER — SODIUM CHLORIDE 0.9 % IV SOLN: 250.0000 mL | INTRAVENOUS | Status: DC | PRN

## 2014-08-12 MED ORDER — ONDANSETRON HCL 4 MG/2ML IJ SOLN
4.0000 mg | Freq: Four times a day (QID) | INTRAMUSCULAR | Status: DC | PRN
  Administered 2014-08-12 – 2014-08-18 (×11): 4 mg via INTRAVENOUS
  Filled 2014-08-12 (×11): qty 2

## 2014-08-12 MED ORDER — FAMOTIDINE IN NACL 20-0.9 MG/50ML-% IV SOLN
20.0000 mg | INTRAVENOUS | Status: DC
  Administered 2014-08-12 – 2014-08-21 (×9): 20 mg via INTRAVENOUS
  Filled 2014-08-12 (×11): qty 50

## 2014-08-12 MED ORDER — KCL IN DEXTROSE-NACL 20-5-0.9 MEQ/L-%-% IV SOLN
INTRAVENOUS | Status: DC
  Administered 2014-08-12 – 2014-08-14 (×4): via INTRAVENOUS
  Filled 2014-08-12 (×7): qty 1000

## 2014-08-12 MED ORDER — HYDROMORPHONE HCL 1 MG/ML IJ SOLN
1.0000 mg | INTRAMUSCULAR | Status: DC | PRN
  Administered 2014-08-12 (×3): 1 mg via INTRAVENOUS
  Administered 2014-08-13 (×2): 0.5 mg via INTRAVENOUS
  Administered 2014-08-14 (×2): 1 mg via INTRAVENOUS
  Filled 2014-08-12 (×7): qty 1

## 2014-08-12 NOTE — Consult Note (Signed)
Referring Provider: Dr. Steward Ros  Primary Care Physician:  Claretta Fraise, MD Primary Gastroenterologist:  Dr. Oneida Alar   Date of Admission: 08/11/14 Date of Consultation: 08/12/14  Reason for Consultation:  Colon mass  HPI:  Cheryl Larson is a 81 y.o. year old female who appears younger than stated age, presenting with abdominal pain worsening in severity and palpable abdominal mass. CT revealed large mass at hepatic flexure of colon measuring 9.6 cm X 6.3 cm X 6.3 cm, obstructing the right colon, with dilatation of ascending colon and cecum as well as multiple small bowel loops. Transverse colon decompressed.   Patient notes right sided abdominal pain for at least a year, worsening over the pat 4-5 weeks. Notes she is quite active overall, cutting the grass, driving, still owns her own home. However, pain has been worsening over the past several weeks, so she has been staying with her daughter, Cheryl Larson. Notes 3 years ago she weighed in the 170s, now 120s. Decreased appetite. Liquid stool last night, intermittent fecal incontinence. No rectal bleeding. NO prior colonoscopy. No FH of colon cancer. Denies N/V. Feels like abdomen is distended at times. Occasional belching.   From review of notes, she has been seeing oncology in [facility] (Dr. Burney Gauze) due to [place]. Appears originally referred back in 2013. According to notes from PCP on 07/18/14, patient was "just seen" by GI and the daughter was stating they did not want to do anything. It is unclear which GI practice had seen patient. Patient tells me she was not offered a colonoscopy, but she was told by someone that they would not operate on a 81 year old woman. She believes she was told "something was there".  Hgb in Feb 7.5, and she received 2 units of blood on 2/22.   Daughter: [name] [phone]. I have attempted to contact daughter for more information regarding prior GI work-up. Message left.   Past Medical History  Diagnosis Date   . Thyroid disease   . Hypertension   . A-fib   . Blood transfusion without reported diagnosis     Past Surgical History  Procedure Laterality Date  . Cholecystectomy      Prior to Admission medications   Medication Sig Start Date End Date Taking? Authorizing Provider  Cholecalciferol 2000 UNITS TABS Take 1 tablet by mouth every morning.   Yes Historical Provider, MD  ciprofloxacin (CIPRO) 250 MG tablet Take 1 tablet (250 mg total) by mouth 2 (two) times daily. 08/04/14  Yes Claretta Fraise, MD  levothyroxine (SYNTHROID, LEVOTHROID) 88 MCG tablet Take 88 mcg by mouth daily before breakfast.   Yes Historical Provider, MD  megestrol (MEGACE) 400 MG/10ML suspension Take 10 mLs (400 mg total) by mouth 2 (two) times daily. 08/04/14  Yes Claretta Fraise, MD  metoprolol succinate (TOPROL-XL) 50 MG 24 hr tablet Take 50 mg by mouth daily. Take with or immediately following a meal.   Yes Historical Provider, MD  primidone (MYSOLINE) 250 MG tablet Take 125 mg by mouth at bedtime.    Yes Historical Provider, MD  traMADol (ULTRAM) 50 MG tablet Take 1 tablet (50 mg total) by mouth every 6 (six) hours as needed. 06/20/14  Yes Sharion Balloon, FNP    Current Facility-Administered Medications  Medication Dose Route Frequency Provider Last Rate Last Dose  . 0.9 %  sodium chloride infusion   Intravenous STAT Fredia Sorrow, MD 75 mL/hr at 08/12/14 0000    . 0.9 %  sodium chloride infusion  250  mL Intravenous PRN Phillips Grout, MD      . HYDROmorphone (DILAUDID) injection 1 mg  1 mg Intravenous Q4H PRN Phillips Grout, MD   1 mg at 08/12/14 0300  . ondansetron (ZOFRAN) tablet 4 mg  4 mg Oral Q6H PRN Phillips Grout, MD       Or  . ondansetron [facility]) injection 4 mg  4 mg Intravenous Q6H PRN Phillips Grout, MD      . sodium chloride 0.9 % injection 3 mL  3 mL Intravenous Q12H Phillips Grout, MD   3 mL at 08/12/14 0230  . sodium chloride 0.9 % injection 3 mL  3 mL Intravenous PRN Phillips Grout, MD         Allergies as of 08/11/2014 - Review Complete 08/11/2014  Allergen Reaction Noted  . Sulfa antibiotics Hives 11/04/2011    Family History  Problem Relation Age of Onset  . Colon cancer Neg Hx     History   Social History  . Marital Status: Widowed    Spouse Name: N/A  . Number of Children: N/A  . Years of Education: N/A   Occupational History  . Not on file.   Social History Main Topics  . Smoking status: Never Smoker   . Smokeless tobacco: Never Used     Comment: NEVER USED TOBACCO  . Alcohol Use: No  . Drug Use: No  . Sexual Activity: Not on file   Other Topics Concern  . Not on file   Social History Narrative    Review of Systems: Gen: see HPI CV: Denies chest pain, heart palpitations, syncope, edema  Resp: Denies shortness of breath with rest, cough, wheezing GI: see HPI GU : Denies urinary burning, urinary frequency, urinary incontinence.  MS: Denies joint pain,swelling, cramping Derm: Denies rash, itching, dry skin Psych: Denies depression, anxiety,confusion, or memory loss Heme: Denies bruising, bleeding, and enlarged lymph nodes.  Physical Exam: Vital signs in last 24 hours: Temp:  [97.6 F (36.4 C)-98.7 F (37.1 C)] 97.6 F (36.4 C) (03/08 0124) Pulse Rate:  [58-78] 69 (03/08 0124) Resp:  [16-26] 20 (03/08 0124) BP: (103-136)/(62-97) 119/62 mmHg (03/08 0124) SpO2:  [96 %-100 %] 98 % (03/08 0124) Weight:  [116 lb (52.617 kg)-123 lb 6.4 oz (55.974 kg)] 123 lb 6.4 oz (55.974 kg) (03/08 0124) Last BM Date: 08/12/14 General:   Alert,  Well-developed, well-nourished, pleasant and cooperative in NAD Head:  Normocephalic and atraumatic. Eyes:  Sclera clear, no icterus.   Conjunctiva pink. Ears:  Normal auditory acuity. Nose:  No deformity, discharge,  or lesions. Mouth:  No deformity or lesions, dentition normal. Lungs:  Clear throughout to auscultation.    Heart:  S1 S2 present without murmurs.  Abdomen:  Distended, round, palpable mass  right-sided abdomen, TTP. +BS Rectal:  Deferred  Extremities:  Without edema. Neurologic:  Alert and  oriented x4y. Skin:  Intact without significant lesions or rashes. Psych:  Alert and cooperative. Normal mood and affect.  Intake/Output from previous day:   Intake/Output this shift:    Lab Results:  Recent Labs  08/11/14 2130  WBC 14.5*  HGB 10.1*  HCT 32.5*  PLT 360   BMET  Recent Labs  08/11/14 1532 08/11/14 2130  NA  --  133*  K  --  4.1  CL  --  100  CO2  --  24  GLUCOSE  --  114*  BUN  --  23  CREATININE 1.00 0.95  CALCIUM  --  9.6   LFT  Recent Labs  08/11/14 2130  PROT 7.6  ALBUMIN 2.7*  AST 19  ALT 11  ALKPHOS 90  BILITOT 0.4    Studies/Results: Ct Abdomen Pelvis W Contrast  08/11/2014   CLINICAL DATA:  RIGHT lower quadrant abdominal pain, feels like she has a mass in that area, 60 pound weight loss over 3 years, occult GI bleeding, hypertension, atrial fibrillation  EXAM: CT ABDOMEN AND PELVIS WITH CONTRAST  TECHNIQUE: Multidetector CT imaging of the abdomen and pelvis was performed using the standard protocol following bolus administration of intravenous contrast. Sagittal and coronal MPR images reconstructed from axial data set.  CONTRAST:  177mL OMNIPAQUE IOHEXOL 300 MG/ML SOLN IV. Dilute oral contrast.  COMPARISON:  None  FINDINGS: Small bibasilar pleural effusions and atelectasis.  Large area of absent enhancement within the spleen consistent with a large splenic infarct.  No splenic vein is identified though perisplenic collaterals are seen.  Small splenule adjacent to splenic hilum.  Atrophic pancreas with CBD dilatation and prior cholecystectomy.  Liver, kidneys, and adrenal glands normal.  Large mass at hepatic flexure of colon measuring 9.6 x 6.3 x 6.3 cm in size consistent with a colonic neoplasm.  Mild infiltrative changes of the fat surrounding the mass are seen which could be related to edema, ascites, or tumor extension.  Mass obstructs the  RIGHT colon, with dilatation of the ascending colon and cecum as well as of multiple small bowel loops.  Transverse colon decompressed.  Diverticulosis of descending and sigmoid colon without evidence of diverticulitis.  Moderate sized hiatal hernia.  Remainder of stomach unremarkable.  Atherosclerotic changes at aorta and coronary arteries.  No additional mass, adenopathy, or free air.  Minimal perihepatic and pelvic ascites.  Bones demineralized with scattered degenerative disc and facet disease changes of the thoracolumbar spine.  IMPRESSION: Large mass at hepatic flexure of colon measuring 9.6 x 6.3 x 6.3 cm in size consistent with a colonic neoplasm, causing colonic and small bowel obstruction.  Minimal peritumoral infiltration could be related to edema/ascites though tumor extension is not excluded.  Minimal ascites.  Atrophic pancreas.  Additionally, large splenic infarct with nonvisualization of the splenic vein and note of scattered collaterals in the perisplenic region.  Findings called to [employer] on 08/11/2014 at 1613 hours.  Findings also discussed with patient and her daughter.   Electronically Signed   By: Lavonia Dana M.D.   On: 08/11/2014 16:37   Dg Chest Port 1 View  08/11/2014   CLINICAL DATA:  Low abdominal pain since October 2015, recent diagnosis of abdominal tumor. Loss of appetite, intermittent diarrhea and constipation. Weakness.  EXAM: PORTABLE CHEST - 1 VIEW  COMPARISON:  None.  FINDINGS: The cardiac silhouette appears mild to moderately enlarged. Mediastinal silhouette is nonsuspicious. Large hiatal hernia. Bold bibasilar strandy densities, mildly elevated LEFT hemidiaphragm. Skin folds project in LEFT chest. No pleural effusion or focal consolidation. Gaseous distended esophagus. Soft tissue planes and included osseous structures are nonsuspicious.  IMPRESSION: Mild to moderate cardiomegaly.  Bibasilar atelectasis.  Large hiatal hernia.   Electronically Signed   By: Elon Alas   On: 08/11/2014 23:17    Impression: 81 year old female with at least year long history of abdominal pain and chronic IDA requiring transfusions, presenting with now large colon mass obstructing the right colon. No prior colonoscopy, and patient states she was told she was not a candidate for surgery (unclear which practice she had seen). Attempted to  call patient's daughter for further information but unavailable at time of consultation.   At this point, not a candidate for a colonoscopy due to obstructing mass. Needs surgical consultation with Dr. Arnoldo Morale for evaluation. Patient desires a more aggressive approach in this scenario if feasible. Despite her age, she is still quite active and healthy.   Plan: Surgical consult placed (I have sent Dr. Arnoldo Morale a text and placed the order) Will attempt to contact daughter again Will follow peripherally with you  Orvil Feil, ANP-BC [facility]        08/12/2014, 9:57 AM  Addendum: spoke with [name], daughter. She states patient was seen by Dr. Benson Norway, who recommended Metamucil. Daughter states she has been "begging" for a CT for some time. She was informed that we recommend surgical consultation. She states understanding.  Orvil Feil, ANP-BC [facility]

## 2014-08-12 NOTE — Progress Notes (Signed)
Addendum:  The patient is a 81 year old woman with history of iron deficiency anemia, chronic atrial fibrillation, and hypothyroidism, who was admitted this morning by Dr. Shanon Brow for diarrhea and abdominal pain. CT of the abdomen and pelvis revealed a large mass at the hepatic flexure of the colon measuring 9.6 cm, causing small bowel and colon and small bowel obstruction. Apparently, the patient had known about the colon mass in the past, but was told that she was not a surgical candidate at that time.  The patient was briefly seen and examined. Her chart, vital signs, laboratory studies were reviewed. She has some abdominal pain, but no active nausea or vomiting. Her vital signs are stable. GI has seen and evaluated the patient and felt that she needed a surgical consultation for evaluation as a colonoscopy would not likely be helpful helpful because of the obstructing mass and the mass would need to be removed regardless of the diagnosis. The patient is alert and oriented and apparently had been fairly active. She would be high risk surgically given her age, but surgical intervention would be applicable for better quality of life in this patient who is an apparent alert and oriented 81 year old.  Gen. surgeon, Dr. Arnoldo Morale saw and evaluated the patient at [facility].. He offered a colon resection of the neoplasm to relieve her obstruction, but the family requested that she be transferred to [facility] so that she will be under the surgical care of [facility]. The patient's daughter, Ms. McDonnel, requested Dr. Zella Richer. I discussed this with general surgeon on call, Dr. Georgette Dover who agreed to see the patient in consultation. The patient will be transferred to [facility], telemetry bed,. She was discussed with hospitalist, Dr. Erlinda Hong.

## 2014-08-12 NOTE — Progress Notes (Signed)
Received patient from [facility] via [place].  Patient AOx4 and VS stable.

## 2014-08-12 NOTE — Progress Notes (Signed)
Patient escorted to [facility] via [place]. Patient was stable and given 1mg  of dilaudid before she left. Family's questions were answered.

## 2014-08-12 NOTE — Progress Notes (Signed)
81 yr female h/o afib/hypothyroidism admitted to [facility] due to ab pain, found to have obstructing colon mass, family want [facility] to be [facility]. currently NPO. vital stable. AAox3.  The patient is alert and oriented and apparently had been fairly active. She would be high risk surgically given her age, but surgical intervention would be applicable for better quality of life in this patient who is an apparent alert and oriented 81 year old.  The patient's daughter, Ms. McDonnel, requested Dr. Zella Richer.   general surgeon on call at [facility], Dr. Georgette Dover made aware of this patient, who agreed to see the patient in consultation.   The patient will be transferred to [facility], telemetry bed.

## 2014-08-12 NOTE — H&P (Signed)
PCP:   Claretta Fraise, MD   Chief Complaint:  diarrhea  HPI: 81 yo female brought in by daughter for diarrhea, pt has been having a "growth" in her colon for 2 years.  Pt really cannot provide accurate details of what has been done about this "growth".  Per report from edp, she was evaluated by a GI physician several months ago, and was not offered any colonoscopy per daughter or pt.  Pt sees heme/onc as outpt for iron def anemia, mentioned in one of their notes that she was referred for GI w/u but was not offered anything.  Pt has been eating.  She has been swelling and has a palpable mass that she says has been getting bigger.  She denies any n/v.  Ct was done that shows colonic mass with obstruction.  We are asked to admit pt for oncology evaluation.  Review of Systems:  Positive and negative as per HPI otherwise all other systems are negative but unreliable  Past Medical History: Past Medical History  Diagnosis Date  . Thyroid disease   . Hypertension   . A-fib   . Blood transfusion without reported diagnosis    Past Surgical History  Procedure Laterality Date  . Cholecystectomy      Medications: Prior to Admission medications   Medication Sig Start Date End Date Taking? Authorizing Provider  Cholecalciferol 2000 UNITS TABS Take 1 tablet by mouth every morning.   Yes Historical Provider, MD  ciprofloxacin (CIPRO) 250 MG tablet Take 1 tablet (250 mg total) by mouth 2 (two) times daily. 08/04/14  Yes Claretta Fraise, MD  levothyroxine (SYNTHROID, LEVOTHROID) 88 MCG tablet Take 88 mcg by mouth daily before breakfast.   Yes Historical Provider, MD  megestrol (MEGACE) 400 MG/10ML suspension Take 10 mLs (400 mg total) by mouth 2 (two) times daily. 08/04/14  Yes Claretta Fraise, MD  metoprolol succinate (TOPROL-XL) 50 MG 24 hr tablet Take 50 mg by mouth daily. Take with or immediately following a meal.   Yes Historical Provider, MD  primidone (MYSOLINE) 250 MG tablet Take 125 mg by mouth at  bedtime.    Yes Historical Provider, MD  traMADol (ULTRAM) 50 MG tablet Take 1 tablet (50 mg total) by mouth every 6 (six) hours as needed. 06/20/14  Yes Sharion Balloon, FNP    Allergies:   Allergies  Allergen Reactions  . Sulfa Antibiotics Hives    Social History:  reports that she has never smoked. She has never used smokeless tobacco. She reports that she does not drink alcohol or use illicit drugs.  Family History: History reviewed. No pertinent family history.  Physical Exam: Filed Vitals:   08/11/14 1920 08/11/14 2128 08/11/14 2131 08/11/14 2230  BP: 108/65 118/68  114/64  Pulse: 58  74 73  Temp: 98.7 F (37.1 C)     TempSrc: Oral     Resp: 16     Height:      Weight:      SpO2: 100%  96% 96%   General appearance: alert, cooperative and no distress Head: Normocephalic, without obvious abnormality, atraumatic Eyes: negative Nose: Nares normal. Septum midline. Mucosa normal. No drainage or sinus tenderness. Neck: no JVD and supple, symmetrical, trachea midline Lungs: clear to auscultation bilaterally Heart: regular rate and rhythm, S1, S2 normal, no murmur, click, rub or gallop Abdomen: normal findings: bowel sounds normal, abnormal findings:  distended and mass, located in the RUQ and hard Extremities: extremities normal, atraumatic, no cyanosis or edema Pulses: 2+ and  symmetric Skin: Skin color, texture, turgor normal. No rashes or lesions Neurologic: Grossly normal    Labs on Admission:   Recent Labs  08/11/14 1532 08/11/14 2130  NA  --  133*  K  --  4.1  CL  --  100  CO2  --  24  GLUCOSE  --  114*  BUN  --  23  CREATININE 1.00 0.95  CALCIUM  --  9.6    Recent Labs  08/11/14 2130  AST 19  ALT 11  ALKPHOS 90  BILITOT 0.4  PROT 7.6  ALBUMIN 2.7*    Recent Labs  08/11/14 2130  LIPASE 12    Recent Labs  08/11/14 2130  WBC 14.5*  NEUTROABS 10.6*  HGB 10.1*  HCT 32.5*  MCV 83.3  PLT 360   Radiological Exams on Admission: Ct  Abdomen Pelvis W Contrast  08/11/2014   CLINICAL DATA:  RIGHT lower quadrant abdominal pain, feels like she has a mass in that area, 60 pound weight loss over 3 years, occult GI bleeding, hypertension, atrial fibrillation  EXAM: CT ABDOMEN AND PELVIS WITH CONTRAST  TECHNIQUE: Multidetector CT imaging of the abdomen and pelvis was performed using the standard protocol following bolus administration of intravenous contrast. Sagittal and coronal MPR images reconstructed from axial data set.  CONTRAST:  130mL OMNIPAQUE IOHEXOL 300 MG/ML SOLN IV. Dilute oral contrast.  COMPARISON:  None  FINDINGS: Small bibasilar pleural effusions and atelectasis.  Large area of absent enhancement within the spleen consistent with a large splenic infarct.  No splenic vein is identified though perisplenic collaterals are seen.  Small splenule adjacent to splenic hilum.  Atrophic pancreas with CBD dilatation and prior cholecystectomy.  Liver, kidneys, and adrenal glands normal.  Large mass at hepatic flexure of colon measuring 9.6 x 6.3 x 6.3 cm in size consistent with a colonic neoplasm.  Mild infiltrative changes of the fat surrounding the mass are seen which could be related to edema, ascites, or tumor extension.  Mass obstructs the RIGHT colon, with dilatation of the ascending colon and cecum as well as of multiple small bowel loops.  Transverse colon decompressed.  Diverticulosis of descending and sigmoid colon without evidence of diverticulitis.  Moderate sized hiatal hernia.  Remainder of stomach unremarkable.  Atherosclerotic changes at aorta and coronary arteries.  No additional mass, adenopathy, or free air.  Minimal perihepatic and pelvic ascites.  Bones demineralized with scattered degenerative disc and facet disease changes of the thoracolumbar spine.  IMPRESSION: Large mass at hepatic flexure of colon measuring 9.6 x 6.3 x 6.3 cm in size consistent with a colonic neoplasm, causing colonic and small bowel obstruction.  Minimal  peritumoral infiltration could be related to edema/ascites though tumor extension is not excluded.  Minimal ascites.  Atrophic pancreas.  Additionally, large splenic infarct with nonvisualization of the splenic vein and note of scattered collaterals in the perisplenic region.  Findings called to [employer] on 08/11/2014 at 1613 hours.  Findings also discussed with patient and her daughter.   Electronically Signed   By: Lavonia Dana M.D.   On: 08/11/2014 16:37   Dg Chest Port 1 View  08/11/2014   CLINICAL DATA:  Low abdominal pain since October 2015, recent diagnosis of abdominal tumor. Loss of appetite, intermittent diarrhea and constipation. Weakness.  EXAM: PORTABLE CHEST - 1 VIEW  COMPARISON:  None.  FINDINGS: The cardiac silhouette appears mild to moderately enlarged. Mediastinal silhouette is nonsuspicious. Large hiatal hernia. Bold bibasilar strandy densities, mildly elevated  LEFT hemidiaphragm. Skin folds project in LEFT chest. No pleural effusion or focal consolidation. Gaseous distended esophagus. Soft tissue planes and included osseous structures are nonsuspicious.  IMPRESSION: Mild to moderate cardiomegaly.  Bibasilar atelectasis.  Large hiatal hernia.   Electronically Signed   By: Elon Alas   On: 08/11/2014 23:17    Assessment/Plan  81 yo female with large colonic mass   Principal Problem:   Colonic mass-  Unclear as to what work up she has had and what has been discussed with her and family.  Keep npo.  Consult GI for possible scoping, however she highly likely will not be able to tolerate any aggressive treatment.  Pt is even unsure if she wants a colonoscopy, per EDP family was insisting on a work up.  Await gi recommendations and further discussions with daughter who will be here in the am and the patient.  Clinically she is not obstructed, however this is likely to eventually occur soon.  Would likely benefit from palliative care conversation.  Active Problems:  Stable unless  o/w noted.   Anemia, iron deficiency-  Followed by dr Marin Olp.   Essential hypertension- stable   Hypothyroidism- stable   Chronic atrial fibrillation-  stable   Partial small bowel obstruction-  Clinically possible mild psbo.   Abdominal mass, RUQ (right upper quadrant)-  As above  obs on med.  Full code.  Deaundre Allston A 08/12/2014, 12:01 AM

## 2014-08-12 NOTE — Progress Notes (Signed)
The patient is a 81 year old woman with history of iron deficiency anemia, chronic atrial fibrillation, and hypothyroidism, who was admitted this morning by Dr. Shanon Brow for diarrhea and abdominal pain. CT of the abdomen and pelvis revealed a large mass at the hepatic flexure of the colon measuring 9.6 cm, causing small bowel and colon and small bowel obstruction. Apparently, the patient had known about the colon mass in the past, but was told that she was not a surgical candidate at that time.  The patient was briefly seen and examined. Her chart, vital signs, laboratory studies were reviewed. She has some abdominal pain, but no active nausea or vomiting. GI has seen and evaluated the patient and felt that she needed a surgical consultation with Dr. Arnoldo Morale for evaluation as a colonoscopy not likely helpful because of the obstructing mass. The patient is alert and oriented and apparently had been fairly active. She would be high risk surgically given her age, but surgical intervention would be applicable for better quality of life in this patient who is an apparent alert and oriented 81 year old.  We'll continue supportive treatment. We'll allow ice chips and sips of clear liquids. We'll add dextrose to the IV fluids. IV Pepcid empirically.

## 2014-08-12 NOTE — ED Notes (Signed)
Patient assisted to restroom via wheelchair with standby assist. Patient voided to give urine specimen. Patient states that she felt like her bowels had to move but unable to go. Patient states that her stomach hurts constantly.

## 2014-08-12 NOTE — Consult Note (Signed)
Patient seen, chart reviewed. Patient does have a colon neoplasm in the right colon causing obstruction. I think she does need a resection of this neoplasm to relieve her obstruction, pending preoperative medical clearance. In discussion with the patient's daughter, they would like to be transferred to [facility] for further management and treatment. Dr. Caryn Section has been made aware.

## 2014-08-12 NOTE — Progress Notes (Signed)
UR completed 

## 2014-08-12 NOTE — Care Management Note (Addendum)
    Page 1 of 1   08/25/2014     2:13:51 PM CARE MANAGEMENT NOTE 08/25/2014  Patient:  Cheryl Larson, Cheryl Larson   Account Number:  [id number]  Date Initiated:  08/12/2014  Documentation initiated by:  Jolene Provost  Subjective/Objective Assessment:   Pt is from home alone but has recently been staying with her daughter so can prepare her meals. Pt independent at baseline and uses no DME's. Pt cont's to drive and hopes she can cont to be indepenent after discharge.     Action/Plan:   Pt plans to discharge back to daughters house. Will cont to follow for CM needs.   Anticipated DC Date:  08/25/2014   Anticipated DC Plan:  SKILLED NURSING FACILITY  In-house referral  Clinical Social Worker      DC Planning Services  CM consult      Choice offered to / List presented to:             Status of service:  Completed, signed off Medicare Important Message given?  YES (If response is "NO", the following Medicare IM given date fields will be blank) Date Medicare IM given:  08/15/2014 Medicare IM given by:  Whitman Hero Date Additional Medicare IM given:  08/25/2014 Additional Medicare IM given by:  Magdalen Spatz  Discharge Disposition:  [place]  Per UR Regulation:  Reviewed for med. necessity/level of care/duration of stay  If discussed at [place] of Stay Meetings, dates discussed:   08/19/2014  08/21/2014    Comments:  08/12/2014 [place], RN, MSN, CM

## 2014-08-13 ENCOUNTER — Other Ambulatory Visit: Payer: Self-pay

## 2014-08-13 ENCOUNTER — Telehealth: Payer: Self-pay | Admitting: Hematology & Oncology

## 2014-08-13 DIAGNOSIS — I482 Chronic atrial fibrillation: Secondary | ICD-10-CM

## 2014-08-13 DIAGNOSIS — R1901 Right upper quadrant abdominal swelling, mass and lump: Secondary | ICD-10-CM

## 2014-08-13 DIAGNOSIS — I4891 Unspecified atrial fibrillation: Secondary | ICD-10-CM

## 2014-08-13 DIAGNOSIS — D509 Iron deficiency anemia, unspecified: Secondary | ICD-10-CM

## 2014-08-13 DIAGNOSIS — Z01818 Encounter for other preprocedural examination: Secondary | ICD-10-CM

## 2014-08-13 DIAGNOSIS — E038 Other specified hypothyroidism: Secondary | ICD-10-CM

## 2014-08-13 LAB — SURGICAL PCR SCREEN
MRSA, PCR: NEGATIVE
STAPHYLOCOCCUS AUREUS: NEGATIVE

## 2014-08-13 LAB — CEA: CEA: 32.6 ng/mL — AB (ref 0.0–4.7)

## 2014-08-13 MED ORDER — METOPROLOL TARTRATE 1 MG/ML IV SOLN
2.5000 mg | Freq: Two times a day (BID) | INTRAVENOUS | Status: DC
  Administered 2014-08-15 – 2014-08-21 (×12): 2.5 mg via INTRAVENOUS
  Filled 2014-08-13 (×19): qty 5

## 2014-08-13 NOTE — Consult Note (Signed)
[facility] Surgery Consult Note  KELLEN HOVER 12/03/1915  [id number].    Requesting MD: Dr. Erlinda Hong PCP:  Dr. Claretta Fraise Chief Complaint/Reason for Consult: Obstructive right colon mass  HPI:  81 y/o white female with PMH hypothyroidism previously on warfarin for AFIB but recently discontinued presenting with RUQ abdominal pain worsening in severity and palpable abdominal mass.  She has a h/o a known "growth" which was not amenable to surgery in the past.  CT revealed large mass at hepatic flexure of colon measuring 9.6 cm X 6.3 cm X 6.3 cm, obstructing the right colon, with dilatation of ascending colon and cecum as well as multiple small bowel loops. Transverse colon decompressed.   Patient notes right sided abdominal pain for at 2-3 years, worsening over the past month. She says she's quite active - cutting the grass, driving, still owns her own home and lives alone.  However, pain has been worsening over the past several weeks, so she has been staying with her daughter [name]) because she hasn't been eating.   Notes 3 years ago she weighed in the 170s, now 120s.  She notes liquid bowel movements persisting, but sometimes incontinent of stool.  No rectal bleeding.  No prior colonoscopy.  No FH of colon cancer.  Denies N/V, fever/chills, CP/SOB.  Feels like abdomen is distended at times. Occasional belching.   She apparently has been seeing oncology in [facility] (Dr. Burney Gauze) due to Iron deficiency anemia, originally referred back in 2013.  According to notes from PCP on 07/18/14, patient was seen by GI and the daughter stated they didn't want anything done.  Hgb in Feb 7.5, and she received 2 units of blood on 2/22.    ROS: All systems reviewed and otherwise negative except for as above  Family History  Problem Relation Age of Onset  . Colon cancer Neg Hx     Past Medical History  Diagnosis Date  . Thyroid disease   . Hypertension   . A-fib   . Blood transfusion without  reported diagnosis     Past Surgical History  Procedure Laterality Date  . Cholecystectomy      Social History:  reports that she has never smoked. She has never used smokeless tobacco. She reports that she does not drink alcohol or use illicit drugs.  Allergies:  Allergies  Allergen Reactions  . Sulfa Antibiotics Hives    Medications Prior to Admission  Medication Sig Dispense Refill  . Cholecalciferol 2000 UNITS TABS Take 1 tablet by mouth every morning.    . ciprofloxacin (CIPRO) 250 MG tablet Take 1 tablet (250 mg total) by mouth 2 (two) times daily. 20 tablet 0  . levothyroxine (SYNTHROID, LEVOTHROID) 88 MCG tablet Take 88 mcg by mouth daily before breakfast.    . megestrol (MEGACE) 400 MG/10ML suspension Take 10 mLs (400 mg total) by mouth 2 (two) times daily. 600 mL 2  . metoprolol succinate (TOPROL-XL) 50 MG 24 hr tablet Take 50 mg by mouth daily. Take with or immediately following a meal.    . primidone (MYSOLINE) 250 MG tablet Take 125 mg by mouth at bedtime.     . traMADol (ULTRAM) 50 MG tablet Take 1 tablet (50 mg total) by mouth every 6 (six) hours as needed. 60 tablet 1    Blood pressure 122/66, pulse 72, temperature 98 F (36.7 C), temperature source Oral, resp. rate 17, height 5' 5"  (1.651 m), weight 129 lb 3.2 oz (58.605 kg), SpO2 97 %.  Physical Exam: General: pleasant, WD/WN white female who is laying in bed in NAD HEENT: head is normocephalic, atraumatic.  Sclera are noninjected.  Cornea's clear, lens replacements?  PERRL.  Ears and nose without any masses or lesions.  Mouth is pink but dry. Heart: regular, rate, and rhythm.  No obvious murmurs, gallops, or rubs noted.  Palpable pedal pulses bilaterally Lungs: CTAB, no wheezes, rhonchi, or rales noted.  Respiratory effort nonlabored Abd: soft, tender in the RUQ and right flank, ND, palpable mass in RUQ, +BS, no hernias, scar from cholecystectomy not visible MS: all 4 extremities are symmetrical with no cyanosis,  clubbing, or edema. Skin: warm and dry with no masses, lesions, or rashes Psych: A&Ox3 with an appropriate affect.   Results for orders placed or performed during the hospital encounter of 08/11/14 (from the past 48 hour(s))  CEA     Status: Abnormal   Collection Time: 08/11/14  9:28 PM  Result Value Ref Range   CEA 32.6 (H) 0.0 - 4.7 ng/mL    Comment: (NOTE)       Roche ECLIA methodology       Nonsmokers  <3.9                                     Smokers     <5.6 Performed At: [facility] [place], [place] [id number] Lindon Romp MD FH:[id number]   CBC with Differential     Status: Abnormal   Collection Time: 08/11/14  9:30 PM  Result Value Ref Range   WBC 14.5 (H) 4.0 - 10.5 K/uL   RBC 3.90 3.87 - 5.11 MIL/uL   Hemoglobin 10.1 (L) 12.0 - 15.0 g/dL   HCT 32.5 (L) 36.0 - 46.0 %   MCV 83.3 78.0 - 100.0 fL   MCH 25.9 (L) 26.0 - 34.0 pg   MCHC 31.1 30.0 - 36.0 g/dL   RDW 19.2 (H) 11.5 - 15.5 %   Platelets 360 150 - 400 K/uL   Neutrophils Relative % 74 43 - 77 %   Neutro Abs 10.6 (H) 1.7 - 7.7 K/uL   Lymphocytes Relative 14 12 - 46 %   Lymphs Abs 2.0 0.7 - 4.0 K/uL   Monocytes Relative 12 3 - 12 %   Monocytes Absolute 1.8 (H) 0.1 - 1.0 K/uL   Eosinophils Relative 0 0 - 5 %   Eosinophils Absolute 0.0 0.0 - 0.7 K/uL   Basophils Relative 0 0 - 1 %   Basophils Absolute 0.0 0.0 - 0.1 K/uL  Comprehensive metabolic panel     Status: Abnormal   Collection Time: 08/11/14  9:30 PM  Result Value Ref Range   Sodium 133 (L) 135 - 145 mmol/L   Potassium 4.1 3.5 - 5.1 mmol/L   Chloride 100 96 - 112 mmol/L   CO2 24 19 - 32 mmol/L   Glucose, Bld 114 (H) 70 - 99 mg/dL   BUN 23 6 - 23 mg/dL   Creatinine, Ser 0.95 0.50 - 1.10 mg/dL   Calcium 9.6 8.4 - 10.5 mg/dL   Total Protein 7.6 6.0 - 8.3 g/dL   Albumin 2.7 (L) 3.5 - 5.2 g/dL   AST 19 0 - 37 U/L   ALT 11 0 - 35 U/L   Alkaline Phosphatase 90 39 - 117 U/L   Total Bilirubin 0.4 0.3 - 1.2 mg/dL   GFR calc non  Af  Amer 49 (L) >90 mL/min   GFR calc Af Amer 57 (L) >90 mL/min    Comment: (NOTE) The eGFR has been calculated using the CKD EPI equation. This calculation has not been validated in all clinical situations. eGFR's persistently <90 mL/min signify possible Chronic Kidney Disease.    Anion gap 9 5 - 15  Lipase, blood     Status: None   Collection Time: 08/11/14  9:30 PM  Result Value Ref Range   Lipase 12 11 - 59 U/L  Urinalysis, Routine w reflex microscopic     Status: Abnormal   Collection Time: 08/11/14 11:58 PM  Result Value Ref Range   Color, Urine AMBER (A) YELLOW    Comment: BIOCHEMICALS MAY BE AFFECTED BY COLOR   APPearance CLEAR CLEAR   Specific Gravity, Urine 1.010 1.005 - 1.030   pH 5.0 5.0 - 8.0   Glucose, UA NEGATIVE NEGATIVE mg/dL   Hgb urine dipstick NEGATIVE NEGATIVE   Bilirubin Urine NEGATIVE NEGATIVE   Ketones, ur NEGATIVE NEGATIVE mg/dL   Protein, ur NEGATIVE NEGATIVE mg/dL   Urobilinogen, UA 0.2 0.0 - 1.0 mg/dL   Nitrite NEGATIVE NEGATIVE   Leukocytes, UA NEGATIVE NEGATIVE    Comment: MICROSCOPIC NOT DONE ON URINES WITH NEGATIVE PROTEIN, BLOOD, LEUKOCYTES, NITRITE, OR GLUCOSE <1000 mg/dL.   Ct Abdomen Pelvis W Contrast  08/11/2014   CLINICAL DATA:  RIGHT lower quadrant abdominal pain, feels like she has a mass in that area, 60 pound weight loss over 3 years, occult GI bleeding, hypertension, atrial fibrillation  EXAM: CT ABDOMEN AND PELVIS WITH CONTRAST  TECHNIQUE: Multidetector CT imaging of the abdomen and pelvis was performed using the standard protocol following bolus administration of intravenous contrast. Sagittal and coronal MPR images reconstructed from axial data set.  CONTRAST:  160m OMNIPAQUE IOHEXOL 300 MG/ML SOLN IV. Dilute oral contrast.  COMPARISON:  None  FINDINGS: Small bibasilar pleural effusions and atelectasis.  Large area of absent enhancement within the spleen consistent with a large splenic infarct.  No splenic vein is identified though  perisplenic collaterals are seen.  Small splenule adjacent to splenic hilum.  Atrophic pancreas with CBD dilatation and prior cholecystectomy.  Liver, kidneys, and adrenal glands normal.  Large mass at hepatic flexure of colon measuring 9.6 x 6.3 x 6.3 cm in size consistent with a colonic neoplasm.  Mild infiltrative changes of the fat surrounding the mass are seen which could be related to edema, ascites, or tumor extension.  Mass obstructs the RIGHT colon, with dilatation of the ascending colon and cecum as well as of multiple small bowel loops.  Transverse colon decompressed.  Diverticulosis of descending and sigmoid colon without evidence of diverticulitis.  Moderate sized hiatal hernia.  Remainder of stomach unremarkable.  Atherosclerotic changes at aorta and coronary arteries.  No additional mass, adenopathy, or free air.  Minimal perihepatic and pelvic ascites.  Bones demineralized with scattered degenerative disc and facet disease changes of the thoracolumbar spine.  IMPRESSION: Large mass at hepatic flexure of colon measuring 9.6 x 6.3 x 6.3 cm in size consistent with a colonic neoplasm, causing colonic and small bowel obstruction.  Minimal peritumoral infiltration could be related to edema/ascites though tumor extension is not excluded.  Minimal ascites.  Atrophic pancreas.  Additionally, large splenic infarct with nonvisualization of the splenic vein and note of scattered collaterals in the perisplenic region.  Findings called to [employer] 08/11/2014 at 1613 hours.  Findings also discussed with patient and her daughter.   Electronically  Signed   By: Lavonia Dana M.D.   On: 08/11/2014 16:37   Dg Chest Port 1 View  08/11/2014   CLINICAL DATA:  Low abdominal pain since October 2015, recent diagnosis of abdominal tumor. Loss of appetite, intermittent diarrhea and constipation. Weakness.  EXAM: PORTABLE CHEST - 1 VIEW  COMPARISON:  None.  FINDINGS: The cardiac silhouette appears mild to moderately  enlarged. Mediastinal silhouette is nonsuspicious. Large hiatal hernia. Bold bibasilar strandy densities, mildly elevated LEFT hemidiaphragm. Skin folds project in LEFT chest. No pleural effusion or focal consolidation. Gaseous distended esophagus. Soft tissue planes and included osseous structures are nonsuspicious.  IMPRESSION: Mild to moderate cardiomegaly.  Bibasilar atelectasis.  Large hiatal hernia.   Electronically Signed   By: Elon Alas   On: 08/11/2014 23:17      Assessment/Plan Obstructing right colon mass -Clears as tolerated, NPO after MN, IVF, pain control, antiemetics -SCD's and hold blood thinner after MN for potential surgery on 08/14/14 -Ambulate and IS -Dr. Georgette Dover to see and evaluate.  We will likely recommend to proceed with surgical interventions if the patient and family are agreeable to proceed.   Iron deficiency anemia - likely due to above AFIB  -No longer on coumadin - taken off due to IDA Hypothyroidism HTN H/o cholecystectomy at age 16?   Coralie Keens, [facility] Surgery 08/13/2014, 7:43 AM Pager: [phone]

## 2014-08-13 NOTE — Progress Notes (Signed)
PATIENT DETAILS Name: Cheryl Larson Age: 81 y.o. Sex: female Date of Birth: July 15, 1915 Admit Date: 08/11/2014 Admitting Physician Florencia Reasons, MD ZOX:[email or web address], MD  Subjective: No major complaints, intermittent diarrhea continues.  Assessment/Plan: Principal Problem:   Colonic mass: Admitted and seen in consultation by general surgery. Potential laparotomy on 3/10. Await 2-D echocardiogram, appreciated cardiology assistance regarding preoperative clearance.  Active Problems:    History of atrial fibrillation: Rate controlled,previously on coumadin. CHADS2VASC score of 4-suspect no longer a anticoagulation given advanced age and frailty. On metoprolol prior to this admission, will start low-dose intravenous metoprolol. Follow.    Hypothyroidism: Continue levothyroxine    Anemia: Likely secondary to colonic mass/chronic illness. Follow.    Large splenic infarct: Seen on CT scan. Not a candidate for long-term anticoagulation. Seems to be asymptomatic without any abdominal pain  Disposition: Remain inpatient  Antibiotics:  See below   Anti-infectives    None      DVT Prophylaxis:  SCD's  Code Status: Full code or DNR  Family Communication None at bedside  Procedures:  None  CONSULTS:  cardiology and general surgery  Time spent 40 minutes-which includes 50% of the time with face-to-face with patient/ family and coordinating care related to the above assessment and plan.  MEDICATIONS: Scheduled Meds: . famotidine (PEPCID) IV  20 mg Intravenous Q24H  . sodium chloride  3 mL Intravenous Q12H   Continuous Infusions: . dextrose 5 % and 0.9 % NaCl with KCl 20 mEq/L 75 mL/hr at 08/13/14 0245   PRN Meds:.sodium chloride, HYDROmorphone (DILAUDID) injection, ondansetron **OR** ondansetron (ZOFRAN) IV, sodium chloride    PHYSICAL EXAM: Vital signs in last 24 hours: Filed Vitals:   08/12/14 0124 08/12/14 2054 08/12/14 2248 08/13/14 0638  BP: 119/62  143/82 122/69 122/66  Pulse: 69 85 85 72  Temp: 97.6 F (36.4 C) 98.6 F (37 C) 98.8 F (37.1 C) 98 F (36.7 C)  TempSrc: Oral Oral Oral Oral  Resp: 20 20 18 17   Height: 5\' 5"  (1.651 m)  5\' 5"  (1.651 m)   Weight: 55.974 kg (123 lb 6.4 oz)  58.605 kg (129 lb 3.2 oz)   SpO2: 98% 97% 97% 97%    Weight change: 5.987 kg (13 lb 3.2 oz) Filed Weights   08/11/14 1701 08/12/14 0124 08/12/14 2248  Weight: 52.617 kg (116 lb) 55.974 kg (123 lb 6.4 oz) 58.605 kg (129 lb 3.2 oz)   Body mass index is 21.5 kg/(m^2).   Gen Exam: Awake and alert with clear speech.   Neck: Supple, No JVD.   Chest: B/L Clear.   CVS: S1 S2 irregular, no murmurs.  Abdomen: soft, BS +,mildly  Tender in lower abd, non distended.  Extremities: no edema, lower extremities warm to touch. Neurologic: Non Focal.   Skin: No Rash.   Wounds: N/A.   Intake/Output from previous day:  Intake/Output Summary (Last 24 hours) at 08/13/14 1438 Last data filed at 08/13/14 0630  Gross per 24 hour  Intake 818.75 ml  Output    700 ml  Net 118.75 ml     LAB RESULTS: CBC  Recent Labs Lab 08/11/14 2130  WBC 14.5*  HGB 10.1*  HCT 32.5*  PLT 360  MCV 83.3  MCH 25.9*  MCHC 31.1  RDW 19.2*  LYMPHSABS 2.0  MONOABS 1.8*  EOSABS 0.0  BASOSABS 0.0    Chemistries   Recent Labs Lab 08/11/14 1532 08/11/14 2130  NA  --  133*  K  --  4.1  CL  --  100  CO2  --  24  GLUCOSE  --  114*  BUN  --  23  CREATININE 1.00 0.95  CALCIUM  --  9.6    CBG: No results for input(s): GLUCAP in the last 168 hours.  GFR Estimated Creatinine Clearance: 31.2 mL/min (by C-G formula based on Cr of 0.95).  Coagulation profile No results for input(s): INR, PROTIME in the last 168 hours.  Cardiac Enzymes No results for input(s): CKMB, TROPONINI, MYOGLOBIN in the last 168 hours.  Invalid input(s): CK  Invalid input(s): POCBNP No results for input(s): DDIMER in the last 72 hours. No results for input(s): HGBA1C in the last 72  hours. No results for input(s): CHOL, HDL, LDLCALC, TRIG, CHOLHDL, LDLDIRECT in the last 72 hours. No results for input(s): TSH, T4TOTAL, T3FREE, THYROIDAB in the last 72 hours.  Invalid input(s): FREET3 No results for input(s): VITAMINB12, FOLATE, FERRITIN, TIBC, IRON, RETICCTPCT in the last 72 hours.  Recent Labs  08/11/14 2130  LIPASE 12    Urine Studies No results for input(s): UHGB, CRYS in the last 72 hours.  Invalid input(s): UACOL, UAPR, USPG, UPH, UTP, UGL, UKET, UBIL, UNIT, UROB, ULEU, UEPI, UWBC, URBC, UBAC, CAST, UCOM, BILUA  MICROBIOLOGY: Recent Results (from the past 240 hour(s))  Urine culture     Status: None   Collection Time: 08/04/14  4:39 PM  Result Value Ref Range Status   Urine Culture, Routine Final report  Final   Result 1 Comment  Final    Comment: Mixed urogenital flora 25,000-50,000 colony forming units per mL     RADIOLOGY STUDIES/RESULTS: Dg Abd 1 View  08/04/2014   CLINICAL DATA:  Right lower quadrant palpable abnormality  EXAM: ABDOMEN - 1 VIEW  COMPARISON:  None.  FINDINGS: Scattered large and small bowel gas is noted. Fecal material is noted throughout the colon. Degenerative changes of lumbar spine with a scoliosis concave to the left are seen. Mild osteophytic changes are noted. No abnormal mass is identified.  IMPRESSION: No abnormality to correspond with the patient's history.   Electronically Signed   By: Inez Catalina M.D.   On: 08/04/2014 16:54   Ct Abdomen Pelvis W Contrast  08/11/2014   CLINICAL DATA:  RIGHT lower quadrant abdominal pain, feels like she has a mass in that area, 60 pound weight loss over 3 years, occult GI bleeding, hypertension, atrial fibrillation  EXAM: CT ABDOMEN AND PELVIS WITH CONTRAST  TECHNIQUE: Multidetector CT imaging of the abdomen and pelvis was performed using the standard protocol following bolus administration of intravenous contrast. Sagittal and coronal MPR images reconstructed from axial data set.  CONTRAST:   141mL OMNIPAQUE IOHEXOL 300 MG/ML SOLN IV. Dilute oral contrast.  COMPARISON:  None  FINDINGS: Small bibasilar pleural effusions and atelectasis.  Large area of absent enhancement within the spleen consistent with a large splenic infarct.  No splenic vein is identified though perisplenic collaterals are seen.  Small splenule adjacent to splenic hilum.  Atrophic pancreas with CBD dilatation and prior cholecystectomy.  Liver, kidneys, and adrenal glands normal.  Large mass at hepatic flexure of colon measuring 9.6 x 6.3 x 6.3 cm in size consistent with a colonic neoplasm.  Mild infiltrative changes of the fat surrounding the mass are seen which could be related to edema, ascites, or tumor extension.  Mass obstructs the RIGHT colon, with dilatation of the ascending colon and cecum as well as of multiple small bowel  loops.  Transverse colon decompressed.  Diverticulosis of descending and sigmoid colon without evidence of diverticulitis.  Moderate sized hiatal hernia.  Remainder of stomach unremarkable.  Atherosclerotic changes at aorta and coronary arteries.  No additional mass, adenopathy, or free air.  Minimal perihepatic and pelvic ascites.  Bones demineralized with scattered degenerative disc and facet disease changes of the thoracolumbar spine.  IMPRESSION: Large mass at hepatic flexure of colon measuring 9.6 x 6.3 x 6.3 cm in size consistent with a colonic neoplasm, causing colonic and small bowel obstruction.  Minimal peritumoral infiltration could be related to edema/ascites though tumor extension is not excluded.  Minimal ascites.  Atrophic pancreas.  Additionally, large splenic infarct with nonvisualization of the splenic vein and note of scattered collaterals in the perisplenic region.  Findings called to [employer] on 08/11/2014 at 1613 hours.  Findings also discussed with patient and her daughter.   Electronically Signed   By: Lavonia Dana M.D.   On: 08/11/2014 16:37   Dg Chest Port 1 View  08/11/2014    CLINICAL DATA:  Low abdominal pain since October 2015, recent diagnosis of abdominal tumor. Loss of appetite, intermittent diarrhea and constipation. Weakness.  EXAM: PORTABLE CHEST - 1 VIEW  COMPARISON:  None.  FINDINGS: The cardiac silhouette appears mild to moderately enlarged. Mediastinal silhouette is nonsuspicious. Large hiatal hernia. Bold bibasilar strandy densities, mildly elevated LEFT hemidiaphragm. Skin folds project in LEFT chest. No pleural effusion or focal consolidation. Gaseous distended esophagus. Soft tissue planes and included osseous structures are nonsuspicious.  IMPRESSION: Mild to moderate cardiomegaly.  Bibasilar atelectasis.  Large hiatal hernia.   Electronically Signed   By: Elon Alas   On: 08/11/2014 23:17    Oren Binet, MD  [facility] Pager:[phone]  If 7PM-7AM, please contact night-coverage [email or web address] Password [email or web address] 08/13/2014, 2:38 PM   LOS: 1 day

## 2014-08-13 NOTE — Consult Note (Addendum)
Cardiology Consultation  Cheryl Larson    [id number] September 19, 1915  Reason for Consult: Pre-op clearance  Requesting Physician: Dr. Erlinda Hong  Primary Cardiologist: none  HPI:  Cheryl Larson is a 81 year old Caucasian female February asked to see for preoperative consult prior to potential undergoing abdominal surgery tomorrow for an obstructing right colon mass.  Cheryl Larson denies any known history of coronary artery disease.  Review of her records indicates she has a history of permanent atrial fibrillation.  She states she had been on Coumadin in the past but this was stopped sometime over the past year (although in the 06/20/2014 office note by Dr. Lenna Gilford there is report that the patient was taking Coumadin at that time).  She states approximatey 6 months ago she developed a transient episode of right-sided this which lasted for 30 minutes and occurred when she was attempting to get out of the shower.  She never sought evaluation.  There is no known objective history of a stroke or prior TIA.  He has a history of hypertension as well as hypothyroidism and in the past has also been followed for iron deficiency anemia.  Over the past 3 years she admits to approximately a 60 pound weight loss.  She has experienced intermittent right-sided abdominal pain for the past 2-3 years which has worsened over the past month.  She was admitted with increasing symptomatology and a CT scan revealed a large mass at the hepatic flexure of the colon measuring [phone] cm obstructing right colon with dilatation of the ascending colon and cecum as well as multiple small bowel loops.  She is tentatively scheduled to undergo surgical treatment, possibly tomorrow.  Cardiology preoperative assessment was requested.  She denies any awareness of CAD.  There is no chest pain.  She denies any increasing shortness of breath.  She is unaware of any tachycardia palpitations.  She denies any frank syncopal spells.   Past Medical  History  Diagnosis Date  . Thyroid disease   . Hypertension   . A-fib   . Blood transfusion without reported diagnosis    Past Surgical History  Procedure Laterality Date  . Cholecystectomy      FAMHx: Family History  Problem Relation Age of Onset  . Colon cancer Neg Hx     SOCHx:  reports that she has never smoked. She has never used smokeless tobacco. She reports that she does not drink alcohol or use illicit drugs. She has lived by herself but for the past 5 weeks, has been living with her daughter due to her increasing abdominal discomfort.    ALLERGIES: Allergies  Allergen Reactions  . Sulfa Antibiotics Hives     HOME MEDICATIONS: Prescriptions prior to admission  Medication Sig Dispense Refill Last Dose  . Cholecalciferol 2000 UNITS TABS Take 1 tablet by mouth every morning.   08/11/2014 at Unknown time  . ciprofloxacin (CIPRO) 250 MG tablet Take 1 tablet (250 mg total) by mouth 2 (two) times daily. 20 tablet 0 08/11/2014 at Unknown time  . levothyroxine (SYNTHROID, LEVOTHROID) 88 MCG tablet Take 88 mcg by mouth daily before breakfast.   08/11/2014 at Unknown time  . megestrol (MEGACE) 400 MG/10ML suspension Take 10 mLs (400 mg total) by mouth 2 (two) times daily. 600 mL 2 08/11/2014 at Unknown time  . metoprolol succinate (TOPROL-XL) 50 MG 24 hr tablet Take 50 mg by mouth daily. Take with or immediately following a meal.   08/11/2014 at noon  . primidone (MYSOLINE) 250 MG tablet  Take 125 mg by mouth at bedtime.    08/10/2014 at Unknown time  . traMADol (ULTRAM) 50 MG tablet Take 1 tablet (50 mg total) by mouth every 6 (six) hours as needed. 60 tablet 1 08/10/2014 at Unknown time    HOSPITAL MEDICATIONS: . famotidine (PEPCID) IV  20 mg Intravenous Q24H  . sodium chloride  3 mL Intravenous Q12H    ROS General: Positive for three-year weight loss of 60 pounds; No fevers, chills, or night sweats;  HEENT: Negative; No changes in vision or hearing, sinus congestion, difficulty  swallowing Pulmonary: Negative; No cough, wheezing, shortness of breath, hemoptysis Cardiovascular: Positive for atrial fibrillation.  Negative for chest pain, PND, orthopnea, syncope.   GI: Positive for obstructing abdominal mass.  Loose stools. GU: Negative; No dysuria, hematuria, or difficulty voiding Musculoskeletal: Negative; no myalgias, joint pain, or weakness Hematologic/Oncology: Positive for history of iron deficiency anemia Endocrine: Positive for hypothyroidism; no diabetes Neuro: Negative; no changes in balance, headaches Skin: Negative; No rashes or skin lesions Psychiatric: Negative; No behavioral problems, depression Sleep: Negative; No snoring, daytime sleepiness, hypersomnolence, bruxism, restless legs, hypnogognic hallucinations, no cataplexy Other comprehensive 14 point system review is negative.   VITALS: Blood pressure 122/66, pulse 72, temperature 98 F (36.7 C), temperature source Oral, resp. rate 17, height 5' 5"  (1.651 m), weight 129 lb 3.2 oz (58.605 kg), SpO2 97 %.  PHYSICAL EXAM: General appearance: alert, cooperative and appears younger than stated age Neck: no adenopathy, no carotid bruit, no JVD, supple, symmetrical, trachea midline and thyroid not enlarged, symmetric, no tenderness/mass/nodules Lungs: clear to auscultation bilaterally Heart: Irregularly irregular rhythm in the 90s.  1/6 systolic murmur.  No S3.  No rubs thrills or heaves. Abdomen: Soft.  Mildly tender in the right upper quadrant. Bowel sounds positive Extremities: no edema, redness or tenderness in the calves or thighs Pulses: 2+ and symmetric Skin: Skin color, texture, turgor normal. No rashes or lesions Neurologic: Grossly normal  ECG (independently read by me): AF at 78 with LAHB from 08/11/2014 at [facility].  Personal review of ECG from 12/15/2012 shows atrial fibrillation at 60 bpm with left axis deviation.  LABS: Results for orders placed or performed during the  hospital encounter of 08/11/14 (from the past 48 hour(s))  CEA     Status: Abnormal   Collection Time: 08/11/14  9:28 PM  Result Value Ref Range   CEA 32.6 (H) 0.0 - 4.7 ng/mL    Comment: (NOTE)       Roche ECLIA methodology       Nonsmokers  <3.9                                     Smokers     <5.6 Performed At: [facility] [place], [place] [id number] Lindon Romp MD [id number]   CBC with Differential     Status: Abnormal   Collection Time: 08/11/14  9:30 PM  Result Value Ref Range   WBC 14.5 (H) 4.0 - 10.5 K/uL   RBC 3.90 3.87 - 5.11 MIL/uL   Hemoglobin 10.1 (L) 12.0 - 15.0 g/dL   HCT 32.5 (L) 36.0 - 46.0 %   MCV 83.3 78.0 - 100.0 fL   MCH 25.9 (L) 26.0 - 34.0 pg   MCHC 31.1 30.0 - 36.0 g/dL   RDW 19.2 (H) 11.5 - 15.5 %   Platelets 360 150 - 400  K/uL   Neutrophils Relative % 74 43 - 77 %   Neutro Abs 10.6 (H) 1.7 - 7.7 K/uL   Lymphocytes Relative 14 12 - 46 %   Lymphs Abs 2.0 0.7 - 4.0 K/uL   Monocytes Relative 12 3 - 12 %   Monocytes Absolute 1.8 (H) 0.1 - 1.0 K/uL   Eosinophils Relative 0 0 - 5 %   Eosinophils Absolute 0.0 0.0 - 0.7 K/uL   Basophils Relative 0 0 - 1 %   Basophils Absolute 0.0 0.0 - 0.1 K/uL  Comprehensive metabolic panel     Status: Abnormal   Collection Time: 08/11/14  9:30 PM  Result Value Ref Range   Sodium 133 (L) 135 - 145 mmol/L   Potassium 4.1 3.5 - 5.1 mmol/L   Chloride 100 96 - 112 mmol/L   CO2 24 19 - 32 mmol/L   Glucose, Bld 114 (H) 70 - 99 mg/dL   BUN 23 6 - 23 mg/dL   Creatinine, Ser 0.95 0.50 - 1.10 mg/dL   Calcium 9.6 8.4 - 10.5 mg/dL   Total Protein 7.6 6.0 - 8.3 g/dL   Albumin 2.7 (L) 3.5 - 5.2 g/dL   AST 19 0 - 37 U/L   ALT 11 0 - 35 U/L   Alkaline Phosphatase 90 39 - 117 U/L   Total Bilirubin 0.4 0.3 - 1.2 mg/dL   GFR calc non Af Amer 49 (L) >90 mL/min   GFR calc Af Amer 57 (L) >90 mL/min    Comment: (NOTE) The eGFR has been calculated using the CKD EPI equation. This calculation has not been  validated in all clinical situations. eGFR's persistently <90 mL/min signify possible Chronic Kidney Disease.    Anion gap 9 5 - 15  Lipase, blood     Status: None   Collection Time: 08/11/14  9:30 PM  Result Value Ref Range   Lipase 12 11 - 59 U/L  Urinalysis, Routine w reflex microscopic     Status: Abnormal   Collection Time: 08/11/14 11:58 PM  Result Value Ref Range   Color, Urine AMBER (A) YELLOW    Comment: BIOCHEMICALS MAY BE AFFECTED BY COLOR   APPearance CLEAR CLEAR   Specific Gravity, Urine 1.010 1.005 - 1.030   pH 5.0 5.0 - 8.0   Glucose, UA NEGATIVE NEGATIVE mg/dL   Hgb urine dipstick NEGATIVE NEGATIVE   Bilirubin Urine NEGATIVE NEGATIVE   Ketones, ur NEGATIVE NEGATIVE mg/dL   Protein, ur NEGATIVE NEGATIVE mg/dL   Urobilinogen, UA 0.2 0.0 - 1.0 mg/dL   Nitrite NEGATIVE NEGATIVE   Leukocytes, UA NEGATIVE NEGATIVE    Comment: MICROSCOPIC NOT DONE ON URINES WITH NEGATIVE PROTEIN, BLOOD, LEUKOCYTES, NITRITE, OR GLUCOSE <1000 mg/dL.    IMAGING: Ct Abdomen Pelvis W Contrast  08/11/2014   CLINICAL DATA:  RIGHT lower quadrant abdominal pain, feels like she has a mass in that area, 60 pound weight loss over 3 years, occult GI bleeding, hypertension, atrial fibrillation  EXAM: CT ABDOMEN AND PELVIS WITH CONTRAST  TECHNIQUE: Multidetector CT imaging of the abdomen and pelvis was performed using the standard protocol following bolus administration of intravenous contrast. Sagittal and coronal MPR images reconstructed from axial data set.  CONTRAST:  183m OMNIPAQUE IOHEXOL 300 MG/ML SOLN IV. Dilute oral contrast.  COMPARISON:  None  FINDINGS: Small bibasilar pleural effusions and atelectasis.  Large area of absent enhancement within the spleen consistent with a large splenic infarct.  No splenic vein is identified though perisplenic collaterals are  seen.  Small splenule adjacent to splenic hilum.  Atrophic pancreas with CBD dilatation and prior cholecystectomy.  Liver, kidneys, and  adrenal glands normal.  Large mass at hepatic flexure of colon measuring 9.6 x 6.3 x 6.3 cm in size consistent with a colonic neoplasm.  Mild infiltrative changes of the fat surrounding the mass are seen which could be related to edema, ascites, or tumor extension.  Mass obstructs the RIGHT colon, with dilatation of the ascending colon and cecum as well as of multiple small bowel loops.  Transverse colon decompressed.  Diverticulosis of descending and sigmoid colon without evidence of diverticulitis.  Moderate sized hiatal hernia.  Remainder of stomach unremarkable.  Atherosclerotic changes at aorta and coronary arteries.  No additional mass, adenopathy, or free air.  Minimal perihepatic and pelvic ascites.  Bones demineralized with scattered degenerative disc and facet disease changes of the thoracolumbar spine.  IMPRESSION: Large mass at hepatic flexure of colon measuring 9.6 x 6.3 x 6.3 cm in size consistent with a colonic neoplasm, causing colonic and small bowel obstruction.  Minimal peritumoral infiltration could be related to edema/ascites though tumor extension is not excluded.  Minimal ascites.  Atrophic pancreas.  Additionally, large splenic infarct with nonvisualization of the splenic vein and note of scattered collaterals in the perisplenic region.  Findings called to [employer] on 08/11/2014 at 1613 hours.  Findings also discussed with patient and her daughter.   Electronically Signed   By: Lavonia Dana M.D.   On: 08/11/2014 16:37   Dg Chest Port 1 View  08/11/2014   CLINICAL DATA:  Low abdominal pain since October 2015, recent diagnosis of abdominal tumor. Loss of appetite, intermittent diarrhea and constipation. Weakness.  EXAM: PORTABLE CHEST - 1 VIEW  COMPARISON:  None.  FINDINGS: The cardiac silhouette appears mild to moderately enlarged. Mediastinal silhouette is nonsuspicious. Large hiatal hernia. Bold bibasilar strandy densities, mildly elevated LEFT hemidiaphragm. Skin folds project in LEFT  chest. No pleural effusion or focal consolidation. Gaseous distended esophagus. Soft tissue planes and included osseous structures are nonsuspicious.  IMPRESSION: Mild to moderate cardiomegaly.  Bibasilar atelectasis.  Large hiatal hernia.   Electronically Signed   By: Elon Alas   On: 08/11/2014 23:17    IMPRESSION/RECOMMENDATION:  1.  Permanent atrial fibrillation.  Remotely, the patient has been on warfarin anticoagulation.  She has not been on this recently.  I have personally reviewed the ECGs from 2014 and on 08/11/2014.  I will repeat an ECG today.  Telemetry reveals atrial fibrillation in the 90s.  Mild-to-moderate cardiomegaly was noted on chest x-ray.  There are no signs of overt CHF on physical exam.  Will obtain 2-D echo Doppler study today to assess systolic and diastolic function, chamber dimensions, valvular architecture,etc. No history to suggest ischemic heart disease or old MI on ECG.    2.  Large obstructive mass at the hepatic flexure suspicious for colonic neoplasm causing colonic and small bowel obstruction.  3.  Large splenic infarct with nonvisualization of the splenic vein, question embolic versus due to compression.  4.  Anemia, with history of iron deficiency.  MCV 83.3; consider follow-up iron studies, ferritin.  5. Stage 3 renal insufficiency with GRF 47  6.  Hypothyroidism: will re-assess TSH on current dose of levothyroxine at 88 g  7.  Transient episode of sided weakness 6 months ago not evaluated.  Question TIA  Unless the echo reveals severe LV dysfunction will give clearance for planned surgical intervention. Recommend postoperative telemetry and  ECG.  Once  She is NPO consider low-dose IV metoprolol to prevent beta blocker withdrawal and AF with RVR.   Attending:  Troy Sine, MD, [facility] 08/13/2014 10:20 AM

## 2014-08-13 NOTE — Telephone Encounter (Signed)
Patient's daughter called stating her mother is in the hospital for surgery.  She cx 08/15/14 apt and will resch later

## 2014-08-13 NOTE — Progress Notes (Signed)
Spoke with the patient and her grand-daughter.  Will plan right hemicolectomy tomorrow morning.  Plan ileocolic anastomosis.  Will need to go to step-down unit post-op.  Cardiac clearance received.    Cheryl Larson. Georgette Dover, MD, [facility]  General/ Trauma Surgery  08/13/2014 5:35 PM

## 2014-08-13 NOTE — Progress Notes (Signed)
  Echocardiogram 2D Echocardiogram has been performed.  Lysle Rubens 08/13/2014, 1:07 PM

## 2014-08-14 ENCOUNTER — Encounter (HOSPITAL_COMMUNITY): Payer: Self-pay | Admitting: Certified Registered Nurse Anesthetist

## 2014-08-14 ENCOUNTER — Encounter (HOSPITAL_COMMUNITY)
Admission: EM | Disposition: A | Discharge status: Skilled Nursing Facility | Payer: Self-pay | Source: Home / Self Care | Attending: Internal Medicine

## 2014-08-14 ENCOUNTER — Inpatient Hospital Stay (HOSPITAL_COMMUNITY): Payer: Medicare Other | Admitting: Certified Registered Nurse Anesthetist

## 2014-08-14 DIAGNOSIS — K639 Disease of intestine, unspecified: Secondary | ICD-10-CM

## 2014-08-14 HISTORY — PX: COLOSTOMY REVISION: SHX5232

## 2014-08-14 LAB — CLOSTRIDIUM DIFFICILE BY PCR: CDIFFPCR: NEGATIVE

## 2014-08-14 LAB — TSH: TSH: 3.632 u[IU]/mL (ref 0.350–4.500)

## 2014-08-14 SURGERY — COLECTOMY, RIGHT
Anesthesia: General | Site: Abdomen | Laterality: Right

## 2014-08-14 MED ORDER — ACETAMINOPHEN 325 MG PO TABS
325.0000 mg | ORAL_TABLET | Freq: Four times a day (QID) | ORAL | Status: DC | PRN
  Administered 2014-08-15: 650 mg via ORAL
  Filled 2014-08-14: qty 2

## 2014-08-14 MED ORDER — TRAMADOL HCL 50 MG PO TABS
50.0000 mg | ORAL_TABLET | Freq: Four times a day (QID) | ORAL | Status: DC | PRN
  Administered 2014-08-15: 50 mg via ORAL
  Filled 2014-08-14: qty 1

## 2014-08-14 MED ORDER — GLYCOPYRROLATE 0.2 MG/ML IJ SOLN
INTRAMUSCULAR | Status: DC | PRN
  Administered 2014-08-14: 0.4 mg via INTRAVENOUS

## 2014-08-14 MED ORDER — 0.9 % SODIUM CHLORIDE (POUR BTL) OPTIME
TOPICAL | Status: DC | PRN
  Administered 2014-08-14: 1000 mL
  Administered 2014-08-14: 2000 mL
  Administered 2014-08-14: 1000 mL

## 2014-08-14 MED ORDER — FENTANYL CITRATE 0.05 MG/ML IJ SOLN
25.0000 ug | INTRAMUSCULAR | Status: DC | PRN
  Administered 2014-08-14 (×4): 25 ug via INTRAVENOUS
  Administered 2014-08-14: 50 ug via INTRAVENOUS

## 2014-08-14 MED ORDER — SUCCINYLCHOLINE CHLORIDE 20 MG/ML IJ SOLN
INTRAMUSCULAR | Status: AC
  Filled 2014-08-14: qty 1

## 2014-08-14 MED ORDER — ONDANSETRON HCL 4 MG/2ML IJ SOLN
INTRAMUSCULAR | Status: AC
  Filled 2014-08-14: qty 2

## 2014-08-14 MED ORDER — VECURONIUM BROMIDE 10 MG IV SOLR
INTRAVENOUS | Status: DC | PRN
  Administered 2014-08-14: 2 mg via INTRAVENOUS

## 2014-08-14 MED ORDER — MIDAZOLAM HCL 2 MG/2ML IJ SOLN
INTRAMUSCULAR | Status: AC
  Filled 2014-08-14: qty 2

## 2014-08-14 MED ORDER — FENTANYL CITRATE 0.05 MG/ML IJ SOLN
INTRAMUSCULAR | Status: AC
  Filled 2014-08-14: qty 5

## 2014-08-14 MED ORDER — ONDANSETRON HCL 4 MG/2ML IJ SOLN
4.0000 mg | Freq: Once | INTRAMUSCULAR | Status: AC | PRN
  Administered 2014-08-14: 4 mg via INTRAVENOUS

## 2014-08-14 MED ORDER — PROPOFOL 10 MG/ML IV BOLUS
INTRAVENOUS | Status: AC
  Filled 2014-08-14: qty 20

## 2014-08-14 MED ORDER — PROPOFOL 10 MG/ML IV BOLUS
INTRAVENOUS | Status: DC | PRN
  Administered 2014-08-14: 40 mg via INTRAVENOUS
  Administered 2014-08-14: 70 mg via INTRAVENOUS
  Administered 2014-08-14: 60 mg via INTRAVENOUS

## 2014-08-14 MED ORDER — EPHEDRINE SULFATE 50 MG/ML IJ SOLN
INTRAMUSCULAR | Status: AC
  Filled 2014-08-14: qty 1

## 2014-08-14 MED ORDER — MORPHINE SULFATE 2 MG/ML IJ SOLN
1.0000 mg | INTRAMUSCULAR | Status: DC | PRN
  Administered 2014-08-14 – 2014-08-16 (×2): 1 mg via INTRAVENOUS
  Administered 2014-08-16: 2 mg via INTRAVENOUS
  Administered 2014-08-17 (×2): 1 mg via INTRAVENOUS
  Administered 2014-08-17 – 2014-08-18 (×2): 2 mg via INTRAVENOUS
  Filled 2014-08-14 (×7): qty 1

## 2014-08-14 MED ORDER — ROCURONIUM BROMIDE 100 MG/10ML IV SOLN
INTRAVENOUS | Status: DC | PRN
Start: 2014-08-14 — End: 2014-08-14
  Administered 2014-08-14: 20 mg via INTRAVENOUS

## 2014-08-14 MED ORDER — ROCURONIUM BROMIDE 50 MG/5ML IV SOLN
INTRAVENOUS | Status: AC
  Filled 2014-08-14: qty 1

## 2014-08-14 MED ORDER — ONDANSETRON HCL 4 MG/2ML IJ SOLN
INTRAMUSCULAR | Status: DC | PRN
  Administered 2014-08-14: 4 mg via INTRAVENOUS

## 2014-08-14 MED ORDER — PROMETHAZINE HCL 25 MG/ML IJ SOLN
INTRAMUSCULAR | Status: AC
  Administered 2014-08-14: 2.5 mg
  Filled 2014-08-14: qty 1

## 2014-08-14 MED ORDER — FENTANYL CITRATE 0.05 MG/ML IJ SOLN
INTRAMUSCULAR | Status: DC | PRN
  Administered 2014-08-14 (×4): 50 ug via INTRAVENOUS

## 2014-08-14 MED ORDER — GLYCOPYRROLATE 0.2 MG/ML IJ SOLN
INTRAMUSCULAR | Status: AC
  Filled 2014-08-14: qty 3

## 2014-08-14 MED ORDER — SODIUM CHLORIDE 0.9 % IJ SOLN
INTRAMUSCULAR | Status: AC
  Filled 2014-08-14: qty 10

## 2014-08-14 MED ORDER — BUPIVACAINE-EPINEPHRINE (PF) 0.25% -1:200000 IJ SOLN
INTRAMUSCULAR | Status: AC
  Filled 2014-08-14: qty 30

## 2014-08-14 MED ORDER — BUPIVACAINE-EPINEPHRINE (PF) 0.25% -1:200000 IJ SOLN
INTRAMUSCULAR | Status: DC | PRN
  Administered 2014-08-14: 10 mL

## 2014-08-14 MED ORDER — LEVOTHYROXINE SODIUM 100 MCG IV SOLR
44.0000 ug | Freq: Every day | INTRAVENOUS | Status: DC
  Administered 2014-08-15 – 2014-08-21 (×7): 44 ug via INTRAVENOUS
  Filled 2014-08-14 (×8): qty 5

## 2014-08-14 MED ORDER — NEOSTIGMINE METHYLSULFATE 10 MG/10ML IV SOLN
INTRAVENOUS | Status: AC
  Filled 2014-08-14: qty 1

## 2014-08-14 MED ORDER — DEXTROSE 5 % IV SOLN
2.0000 g | INTRAVENOUS | Status: AC
  Administered 2014-08-14: 2 g via INTRAVENOUS
  Filled 2014-08-14: qty 2

## 2014-08-14 MED ORDER — FENTANYL CITRATE 0.05 MG/ML IJ SOLN
INTRAMUSCULAR | Status: AC
  Filled 2014-08-14: qty 2

## 2014-08-14 MED ORDER — PROMETHAZINE HCL 25 MG/ML IJ SOLN
2.5000 mg | Freq: Once | INTRAMUSCULAR | Status: AC
  Administered 2014-08-14: 2.5 mg via INTRAVENOUS

## 2014-08-14 MED ORDER — LACTATED RINGERS IV SOLN
INTRAVENOUS | Status: DC | PRN
  Administered 2014-08-14 (×2): via INTRAVENOUS

## 2014-08-14 MED ORDER — METOPROLOL TARTRATE 1 MG/ML IV SOLN
INTRAVENOUS | Status: DC | PRN
  Administered 2014-08-14: 2.5 mg via INTRAVENOUS

## 2014-08-14 MED ORDER — NEOSTIGMINE METHYLSULFATE 10 MG/10ML IV SOLN
INTRAVENOUS | Status: DC | PRN
  Administered 2014-08-14: 3 mg via INTRAVENOUS

## 2014-08-14 MED ORDER — LIDOCAINE HCL (CARDIAC) 20 MG/ML IV SOLN
INTRAVENOUS | Status: AC
  Filled 2014-08-14: qty 5

## 2014-08-14 MED ORDER — LACTATED RINGERS IV SOLN
INTRAVENOUS | Status: DC
  Administered 2014-08-14 (×2): via INTRAVENOUS

## 2014-08-14 MED ORDER — ARTIFICIAL TEARS OP OINT
TOPICAL_OINTMENT | OPHTHALMIC | Status: AC
  Filled 2014-08-14: qty 3.5

## 2014-08-14 MED ORDER — LIDOCAINE HCL (CARDIAC) 20 MG/ML IV SOLN
INTRAVENOUS | Status: DC | PRN
  Administered 2014-08-14: 60 mg via INTRAVENOUS

## 2014-08-14 MED ORDER — VECURONIUM BROMIDE 10 MG IV SOLR
INTRAVENOUS | Status: AC
  Filled 2014-08-14: qty 10

## 2014-08-14 SURGICAL SUPPLY — 63 items
APPLICATOR COTTON TIP 6IN STRL (MISCELLANEOUS) ×3 IMPLANT
BLADE SURG ROTATE 9660 (MISCELLANEOUS) IMPLANT
CANISTER SUCTION 2500CC (MISCELLANEOUS) ×3 IMPLANT
CHLORAPREP W/TINT 26ML (MISCELLANEOUS) ×3 IMPLANT
COVER MAYO STAND STRL (DRAPES) ×3 IMPLANT
COVER SURGICAL LIGHT HANDLE (MISCELLANEOUS) ×6 IMPLANT
DRAPE LAPAROSCOPIC ABDOMINAL (DRAPES) ×3 IMPLANT
DRAPE PROXIMA HALF (DRAPES) IMPLANT
DRAPE WARM FLUID 44X44 (DRAPE) ×3 IMPLANT
DRSG OPSITE POSTOP 4X10 (GAUZE/BANDAGES/DRESSINGS) ×3 IMPLANT
DRSG OPSITE POSTOP 4X8 (GAUZE/BANDAGES/DRESSINGS) IMPLANT
ELECT BLADE 4.0 EZ CLEAN MEGAD (MISCELLANEOUS) ×3
ELECT BLADE 6.5 EXT (BLADE) ×3 IMPLANT
ELECT CAUTERY BLADE 6.4 (BLADE) ×6 IMPLANT
ELECT REM PT RETURN 9FT ADLT (ELECTROSURGICAL) ×3
ELECTRODE BLDE 4.0 EZ CLN MEGD (MISCELLANEOUS) ×1 IMPLANT
ELECTRODE REM PT RTRN 9FT ADLT (ELECTROSURGICAL) ×1 IMPLANT
GLOVE BIO SURGEON STRL SZ 6.5 (GLOVE) ×2 IMPLANT
GLOVE BIO SURGEON STRL SZ7 (GLOVE) ×6 IMPLANT
GLOVE BIO SURGEONS STRL SZ 6.5 (GLOVE) ×1
GLOVE BIOGEL PI IND STRL 7.0 (GLOVE) ×5 IMPLANT
GLOVE BIOGEL PI IND STRL 7.5 (GLOVE) ×2 IMPLANT
GLOVE BIOGEL PI INDICATOR 7.0 (GLOVE) ×10
GLOVE BIOGEL PI INDICATOR 7.5 (GLOVE) ×4
GLOVE SURG SS PI 7.0 STRL IVOR (GLOVE) ×6 IMPLANT
GLOVE SURG SS PI 7.5 STRL IVOR (GLOVE) ×6 IMPLANT
GOWN STRL REUS W/ TWL LRG LVL3 (GOWN DISPOSABLE) ×6 IMPLANT
GOWN STRL REUS W/TWL LRG LVL3 (GOWN DISPOSABLE) ×12
KIT BASIN OR (CUSTOM PROCEDURE TRAY) ×3 IMPLANT
KIT ROOM TURNOVER OR (KITS) ×3 IMPLANT
LEGGING LITHOTOMY PAIR STRL (DRAPES) IMPLANT
LIGASURE IMPACT 36 18CM CVD LR (INSTRUMENTS) ×3 IMPLANT
NEEDLE HYPO 25GX1X1/2 BEV (NEEDLE) ×3 IMPLANT
NS IRRIG 1000ML POUR BTL (IV SOLUTION) ×9 IMPLANT
PACK GENERAL/GYN (CUSTOM PROCEDURE TRAY) ×3 IMPLANT
PAD ARMBOARD 7.5X6 YLW CONV (MISCELLANEOUS) ×6 IMPLANT
PENCIL BUTTON HOLSTER BLD 10FT (ELECTRODE) ×6 IMPLANT
RELOAD PROXIMATE 75MM BLUE (ENDOMECHANICALS) ×6 IMPLANT
SPONGE INTESTINAL PEANUT (DISPOSABLE) ×3 IMPLANT
SPONGE LAP 18X18 X RAY DECT (DISPOSABLE) ×3 IMPLANT
STAPLER GUN LINEAR PROX 60 (STAPLE) ×3 IMPLANT
STAPLER PROXIMATE 75MM BLUE (STAPLE) ×3 IMPLANT
STAPLER VISISTAT 35W (STAPLE) ×3 IMPLANT
SUCTION POOLE TIP (SUCTIONS) ×3 IMPLANT
SURGILUBE 2OZ TUBE FLIPTOP (MISCELLANEOUS) IMPLANT
SUT PDS AB 1 TP1 96 (SUTURE) ×6 IMPLANT
SUT PROLENE 2 0 CT2 30 (SUTURE) IMPLANT
SUT PROLENE 2 0 KS (SUTURE) IMPLANT
SUT SILK 2 0 SH CR/8 (SUTURE) ×3 IMPLANT
SUT SILK 2 0 TIES 10X30 (SUTURE) ×3 IMPLANT
SUT SILK 3 0 SH CR/8 (SUTURE) ×3 IMPLANT
SUT SILK 3 0 TIES 10X30 (SUTURE) ×3 IMPLANT
SUT VIC AB 3-0 SH 18 (SUTURE) IMPLANT
SYR BULB IRRIGATION 50ML (SYRINGE) ×3 IMPLANT
SYR CONTROL 10ML LL (SYRINGE) ×3 IMPLANT
TOWEL OR 17X26 10 PK STRL BLUE (TOWEL DISPOSABLE) ×6 IMPLANT
TRAY FOLEY CATH 16FRSI W/METER (SET/KITS/TRAYS/PACK) ×3 IMPLANT
TRAY PROCTOSCOPIC FIBER OPTIC (SET/KITS/TRAYS/PACK) IMPLANT
TUBE CONNECTING 12'X1/4 (SUCTIONS) ×1
TUBE CONNECTING 12X1/4 (SUCTIONS) ×2 IMPLANT
UNDERPAD 30X30 INCONTINENT (UNDERPADS AND DIAPERS) IMPLANT
WATER STERILE IRR 1000ML POUR (IV SOLUTION) ×3 IMPLANT
YANKAUER SUCT BULB TIP NO VENT (SUCTIONS) ×3 IMPLANT

## 2014-08-14 NOTE — Progress Notes (Signed)
Day of Surgery  Subjective: No complaints No problems overnight Ready to proceed with surgery Had one episode of diarrhea this morning - not watery; C. Diff pending but not likely  Objective: Vital signs in last 24 hours: Temp:  [97.7 F (36.5 C)-98.6 F (37 C)] 97.7 F (36.5 C) (03/10 0749) Pulse Rate:  [66-86] 76 (03/10 0749) Resp:  [16-18] 18 (03/10 0749) BP: (115-145)/(60-76) 145/75 mmHg (03/10 0749) SpO2:  [95 %-100 %] 100 % (03/10 0749) Last BM Date: 08/13/14  Intake/Output from previous day: 03/09 0701 - 03/10 0700 In: 2026.3 [P.O.:240; I.V.:1786.3] Out: 200 [Urine:200] Intake/Output this shift:    General appearance: alert, cooperative and no distress Resp: clear to auscultation bilaterally Cardio: regular rate and rhythm, S1, S2 normal, no murmur, click, rub or gallop GI: soft, + BS; palpable mass right side of abdomen  Lab Results:   Recent Labs  08/11/14 2130  WBC 14.5*  HGB 10.1*  HCT 32.5*  PLT 360   BMET  Recent Labs  08/11/14 1532 08/11/14 2130  NA  --  133*  K  --  4.1  CL  --  100  CO2  --  24  GLUCOSE  --  114*  BUN  --  23  CREATININE 1.00 0.95  CALCIUM  --  9.6   PT/INR No results for input(s): LABPROT, INR in the last 72 hours. ABG No results for input(s): PHART, HCO3 in the last 72 hours.  Invalid input(s): PCO2, PO2  Studies/Results: No results found.  Anti-infectives: Anti-infectives    Start     Dose/Rate Route Frequency Ordered Stop   08/14/14 0800  cefoTEtan (CEFOTAN) 2 g in dextrose 5 % 50 mL IVPB    Comments:  Pharmacy may adjust dose strength for optimal dosing.   Send with patient on call to the OR.  Anesthesia to complete antibiotic administration <51min prior to incision per [facility].   2 g 100 mL/hr over 30 Minutes Intravenous On call to O.R. 08/14/14 0756 08/15/14 0559      Assessment/Plan:  Procedure(s):   HEMI COLECTOMY RIGHT (Right) Surgery today  The surgical procedure has been discussed with  the patient.  Potential risks, benefits, alternative treatments, and expected outcomes have been explained.  All of the patient's questions at this time have been answered.  The likelihood of reaching the patient's treatment goal is good.  The patient understand the proposed surgical procedure and wishes to proceed.   LOS: 2 days    Miroslav Gin K. 08/14/2014

## 2014-08-14 NOTE — Anesthesia Preprocedure Evaluation (Addendum)
Anesthesia Evaluation  Patient identified by MRN, date of birth, ID band Patient awake    Reviewed: Allergy & Precautions, NPO status , Patient's Chart, lab work & pertinent test results  Airway Mallampati: II  TM Distance: >3 FB Neck ROM: Full    Dental  (+) Dental Advisory Given   Pulmonary neg pulmonary ROS,  breath sounds clear to auscultation        Cardiovascular hypertension, Pt. on medications and Pt. on home beta blockers + dysrhythmias Atrial Fibrillation Rhythm:Regular Rate:Normal     Neuro/Psych TIA   GI/Hepatic Neg liver ROS, Colon CA   Endo/Other  Hypothyroidism   Renal/GU negative Renal ROS     Musculoskeletal   Abdominal   Peds  Hematology  (+) anemia ,   Anesthesia Other Findings   Reproductive/Obstetrics                           Anesthesia Physical Anesthesia Plan  ASA: III  Anesthesia Plan: General   Post-op Pain Management:    Induction: Intravenous  Airway Management Planned: Oral ETT  Additional Equipment:   Intra-op Plan:   Post-operative Plan: Extubation in OR  Informed Consent: I have reviewed the patients History and Physical, chart, labs and discussed the procedure including the risks, benefits and alternatives for the proposed anesthesia with the patient or authorized representative who has indicated his/her understanding and acceptance.   Dental advisory given  Plan Discussed with: CRNA  Anesthesia Plan Comments:         Anesthesia Quick Evaluation

## 2014-08-14 NOTE — Transfer of Care (Signed)
Immediate Anesthesia Transfer of Care Note  Patient: Cheryl Larson  Procedure(s) Performed: Procedure(s): RIGHT HEMI COLECTOMY (Right)  Patient Location: PACU  Anesthesia Type:General  Level of Consciousness: awake, alert , oriented and patient cooperative  Airway & Oxygen Therapy: Patient Spontanous Breathing and Patient connected to nasal cannula oxygen  Post-op Assessment: Report given to RN, Post -op Vital signs reviewed and stable and Patient moving all extremities  Post vital signs: Reviewed and stable  Complications: No apparent anesthesia complications

## 2014-08-14 NOTE — Progress Notes (Signed)
Informed consents were not signed patient and granddaughter state they will need to speak with surgeon in pre-op before they will be signed

## 2014-08-14 NOTE — Progress Notes (Signed)
Lunch releif by M. Lovena Le RN

## 2014-08-14 NOTE — Op Note (Signed)
Prep diagnosis: Large right colon cancer with partial bowel structure Postop diagnosis: Same Procedure performed: Right hemicolectomy Surgeon:Charlei Ramsaran K. Anesthesia: Gen. endotracheal Indications: This is a 81 year old female in relatively good health who presents with gradual symptoms of the last 10 years of worsening right upper quadrant abdominal pain, iron deficiency anemia with weakness requiring transfusions 2, and changes in her bowel habits. She finally presented to the emergency department at [facility] where a CT scan showed evidence of a large mass in the right colon near the hepatic flexure consistent with a large colon cancer. There is no sign of metastatic disease to the liver. Her CEA level is elevated at 33. Despite her advanced age, she is nearly obstructed and is of reasonably good functional status so we have recommended right hemicolectomy.  Description of procedure: The patient is brought to the operating room and placed in a supine position on the operating room table. After an adequate level of general anesthesia was obtained, a Foley catheter was placed under sterile technique.  Her abdomen was prepped with ChloraPrep and draped sterile fashion. A timeout was taken to ensure the proper patient and proper procedure. We made a vertical midline incision. Dissection was carried down to the linea alba which was opened with cautery. We into the peritoneal cavity. The patient has minimal ascites within her abdomen. The stomach appears normal. The liver has some old scarring with evidence of previous cholecystectomy but no obvious sign of metastatic disease. She has a very large hard mass in the right upper quadrant involving the hepatic flexure of the colon. This was densely adherent to the right lateral abdominal wall. The terminal ileum is mildly dilated. The colon distal to the mass is normal in appearance and relatively decompressed. We began mobilizing the right colon by score the  peritoneum. I was able bluntly dissect the cecum away from the lateral abdominal wall. However when we got up to the area of the tumor where indicated the abdominal wall we had to bluntly dissect the tumor away from the abdominal wall. There is some gross invasion into the peritoneum laterally. We continued mobilizing the colon medially. The duodenum was densely adherent to this area but we're able to meticulously and gently bluntly dissect the duodenum away. We divided the transverse colon in its midportion with a GIA-75 stapler. The mesentery of the transverse colon was divided with the LigaSure device. We divided the terminal ileum about 10 cm proximal to the ileocecal valve with a GIA-75 stapler. The mesentery was divided with the LigaSure device. We adequately mobilized the right colon medially and divided the mesentery with the LigaSure. The entire specimen was sent for pathologic examination. We irrigated thoroughly and inspected for hemostasis. The right colon was palpated a behind the liver. Cautery was used to excise all the gross tumor from the lateral abdominal wall. We cauterized this area thoroughly. We irrigated the entire abdomen with warm saline as well as a liter of sterile water. We created a side-to-side ileocolic anastomosis with a GIA-75 stapler. The crotch of the anastomosis was reinforced with 3-0 silk. The mesentery was closed with 2-0 silk. We again irrigated and changed her gloves and gowns according to the colon Protocol. The fascia was reapproximated with double-stranded #1 PDS suture. The subcutaneous tissues were irrigated and staples were used to close skin. The patient was then extubated and brought to recovery was stable condition. All sponge, initially, and needle counts are correct.  Imogene Burn. Georgette Dover, MD, [facility]  08/14/2014 11:47 AM

## 2014-08-14 NOTE — Progress Notes (Signed)
PATIENT DETAILS Name: Cheryl Larson Age: 81 y.o. Sex: female Date of Birth: 1915/08/31 Admit Date: 08/11/2014 Admitting Physician Florencia Reasons, MD IEP:PIRJJO,ACZYSA, MD  Subjective: Seen in the [place] and alert-"have been better"  Assessment/Plan: Principal Problem:   Colonic mass: Admitted and seen in consultation by general surgery.Underwent right hemicolectomy on 3/10. Gen surgery following-defer post op care to Gen Surgery  Active Problems:    History of atrial fibrillation: Rate controlled,previously on coumadin. CHADS2VASC score of 4-suspect no longer a anticoagulation given advanced age,frailty and nowj ust post surgery. Continue intravenous metoprolol. Follow.    Hypothyroidism: Continue levothyroxine    Anemia: Likely secondary to colonic mass/chronic illness. Follow CBC    Large splenic infarct: Seen on CT scan. Not a candidate for long-term anticoagulation. Seems to be asymptomatic without any abdominal pain  Disposition: Remain inpatient-suspect may require SNF on discharge.   Antibiotics:  See below   Anti-infectives    Start     Dose/Rate Route Frequency Ordered Stop   08/14/14 0830  [MAR Hold]  cefoTEtan (CEFOTAN) 2 g in dextrose 5 % 50 mL IVPB     (MAR Hold since 08/14/14 0818)  Comments:  Pharmacy may adjust dose strength for optimal dosing.   Send with patient on call to the OR.  Anesthesia to complete antibiotic administration <63min prior to incision per [facility].   2 g 100 mL/hr over 30 Minutes Intravenous On call to O.R. 08/14/14 0756 08/14/14 0930      DVT Prophylaxis:  SCD's  Code Status: Full code or DNR  Family Communication None at bedside  Procedures:  None  CONSULTS:  cardiology and general surgery  MEDICATIONS: Scheduled Meds: . [MAR Hold] famotidine (PEPCID) IV  20 mg Intravenous Q24H  . fentaNYL      . fentaNYL      . [MAR Hold] metoprolol  2.5 mg Intravenous Q12H  . ondansetron      . [MAR Hold] sodium  chloride  3 mL Intravenous Q12H   Continuous Infusions: . dextrose 5 % and 0.9 % NaCl with KCl 20 mEq/L 75 mL/hr at 08/13/14 2219  . lactated ringers 10 mL/hr at 08/14/14 1247   PRN Meds:.[MAR Hold] sodium chloride, fentaNYL, [MAR Hold]  HYDROmorphone (DILAUDID) injection, [MAR Hold] ondansetron **OR** [MAR Hold] ondansetron (ZOFRAN) IV, [MAR Hold] sodium chloride    PHYSICAL EXAM: Vital signs in last 24 hours: Filed Vitals:   08/14/14 1422 08/14/14 1430 08/14/14 1452 08/14/14 1500  BP: 154/88  142/77   Pulse:  83  83  Temp:      TempSrc:      Resp:  20  20  Height:      Weight:      SpO2:  99%  99%    Weight change:  Filed Weights   08/11/14 1701 08/12/14 0124 08/12/14 2248  Weight: 52.617 kg (116 lb) 55.974 kg (123 lb 6.4 oz) 58.605 kg (129 lb 3.2 oz)   Body mass index is 21.5 kg/(m^2).   Gen Exam: Awake and alert with clear speech.   Neck: Supple, No JVD.   Chest: B/L Clear.   CVS: S1 S2 irregular, no murmurs.  Abdomen: soft, BS +,Appropriately tender-dressing in place Extremities: no edema, lower extremities warm to touch. Neurologic: Non Focal.   Skin: No Rash.   Wounds: N/A.   Intake/Output from previous day:  Intake/Output Summary (Last 24 hours) at 08/14/14 1551 Last data filed at 08/14/14 1129  Gross per  24 hour  Intake 2888.75 ml  Output    350 ml  Net 2538.75 ml     LAB RESULTS: CBC  Recent Labs Lab 08/11/14 2130  WBC 14.5*  HGB 10.1*  HCT 32.5*  PLT 360  MCV 83.3  MCH 25.9*  MCHC 31.1  RDW 19.2*  LYMPHSABS 2.0  MONOABS 1.8*  EOSABS 0.0  BASOSABS 0.0    Chemistries   Recent Labs Lab 08/11/14 1532 08/11/14 2130  NA  --  133*  K  --  4.1  CL  --  100  CO2  --  24  GLUCOSE  --  114*  BUN  --  23  CREATININE 1.00 0.95  CALCIUM  --  9.6    CBG: No results for input(s): GLUCAP in the last 168 hours.  GFR Estimated Creatinine Clearance: 31.2 mL/min (by C-G formula based on Cr of 0.95).  Coagulation profile No results  for input(s): INR, PROTIME in the last 168 hours.  Cardiac Enzymes No results for input(s): CKMB, TROPONINI, MYOGLOBIN in the last 168 hours.  Invalid input(s): CK  Invalid input(s): POCBNP No results for input(s): DDIMER in the last 72 hours. No results for input(s): HGBA1C in the last 72 hours. No results for input(s): CHOL, HDL, LDLCALC, TRIG, CHOLHDL, LDLDIRECT in the last 72 hours.  Recent Labs  08/14/14 0650  TSH 3.632   No results for input(s): VITAMINB12, FOLATE, FERRITIN, TIBC, IRON, RETICCTPCT in the last 72 hours.  Recent Labs  08/11/14 2130  LIPASE 12    Urine Studies No results for input(s): UHGB, CRYS in the last 72 hours.  Invalid input(s): UACOL, UAPR, USPG, UPH, UTP, UGL, UKET, UBIL, UNIT, UROB, ULEU, UEPI, UWBC, URBC, UBAC, CAST, UCOM, BILUA  MICROBIOLOGY: Recent Results (from the past 240 hour(s))  Urine culture     Status: None   Collection Time: 08/04/14  4:39 PM  Result Value Ref Range Status   Urine Culture, Routine Final report  Final   Result 1 Comment  Final    Comment: Mixed urogenital flora 25,000-50,000 colony forming units per mL   Clostridium Difficile by PCR     Status: None   Collection Time: 08/13/14  6:11 PM  Result Value Ref Range Status   C difficile by pcr NEGATIVE NEGATIVE Final  Surgical pcr screen     Status: None   Collection Time: 08/13/14  7:19 PM  Result Value Ref Range Status   MRSA, PCR NEGATIVE NEGATIVE Final   Staphylococcus aureus NEGATIVE NEGATIVE Final    Comment:        The Xpert SA Assay (FDA approved for NASAL specimens in patients over 79 years of age), is one component of a comprehensive surveillance program.  Test performance has been validated by [facility] for patients greater than or equal to 86 year old. It is not intended to diagnose infection nor to guide or monitor treatment.     RADIOLOGY STUDIES/RESULTS: Dg Abd 1 View  08/04/2014   CLINICAL DATA:  Right lower quadrant palpable  abnormality  EXAM: ABDOMEN - 1 VIEW  COMPARISON:  None.  FINDINGS: Scattered large and small bowel gas is noted. Fecal material is noted throughout the colon. Degenerative changes of lumbar spine with a scoliosis concave to the left are seen. Mild osteophytic changes are noted. No abnormal mass is identified.  IMPRESSION: No abnormality to correspond with the patient's history.   Electronically Signed   By: Inez Catalina M.D.   On: 08/04/2014 16:54  Ct Abdomen Pelvis W Contrast  08/11/2014   CLINICAL DATA:  RIGHT lower quadrant abdominal pain, feels like she has a mass in that area, 60 pound weight loss over 3 years, occult GI bleeding, hypertension, atrial fibrillation  EXAM: CT ABDOMEN AND PELVIS WITH CONTRAST  TECHNIQUE: Multidetector CT imaging of the abdomen and pelvis was performed using the standard protocol following bolus administration of intravenous contrast. Sagittal and coronal MPR images reconstructed from axial data set.  CONTRAST:  187mL OMNIPAQUE IOHEXOL 300 MG/ML SOLN IV. Dilute oral contrast.  COMPARISON:  None  FINDINGS: Small bibasilar pleural effusions and atelectasis.  Large area of absent enhancement within the spleen consistent with a large splenic infarct.  No splenic vein is identified though perisplenic collaterals are seen.  Small splenule adjacent to splenic hilum.  Atrophic pancreas with CBD dilatation and prior cholecystectomy.  Liver, kidneys, and adrenal glands normal.  Large mass at hepatic flexure of colon measuring 9.6 x 6.3 x 6.3 cm in size consistent with a colonic neoplasm.  Mild infiltrative changes of the fat surrounding the mass are seen which could be related to edema, ascites, or tumor extension.  Mass obstructs the RIGHT colon, with dilatation of the ascending colon and cecum as well as of multiple small bowel loops.  Transverse colon decompressed.  Diverticulosis of descending and sigmoid colon without evidence of diverticulitis.  Moderate sized hiatal hernia.   Remainder of stomach unremarkable.  Atherosclerotic changes at aorta and coronary arteries.  No additional mass, adenopathy, or free air.  Minimal perihepatic and pelvic ascites.  Bones demineralized with scattered degenerative disc and facet disease changes of the thoracolumbar spine.  IMPRESSION: Large mass at hepatic flexure of colon measuring 9.6 x 6.3 x 6.3 cm in size consistent with a colonic neoplasm, causing colonic and small bowel obstruction.  Minimal peritumoral infiltration could be related to edema/ascites though tumor extension is not excluded.  Minimal ascites.  Atrophic pancreas.  Additionally, large splenic infarct with nonvisualization of the splenic vein and note of scattered collaterals in the perisplenic region.  Findings called to [employer] on 08/11/2014 at 1613 hours.  Findings also discussed with patient and her daughter.   Electronically Signed   By: Lavonia Dana M.D.   On: 08/11/2014 16:37   Dg Chest Port 1 View  08/11/2014   CLINICAL DATA:  Low abdominal pain since October 2015, recent diagnosis of abdominal tumor. Loss of appetite, intermittent diarrhea and constipation. Weakness.  EXAM: PORTABLE CHEST - 1 VIEW  COMPARISON:  None.  FINDINGS: The cardiac silhouette appears mild to moderately enlarged. Mediastinal silhouette is nonsuspicious. Large hiatal hernia. Bold bibasilar strandy densities, mildly elevated LEFT hemidiaphragm. Skin folds project in LEFT chest. No pleural effusion or focal consolidation. Gaseous distended esophagus. Soft tissue planes and included osseous structures are nonsuspicious.  IMPRESSION: Mild to moderate cardiomegaly.  Bibasilar atelectasis.  Large hiatal hernia.   Electronically Signed   By: Elon Alas   On: 08/11/2014 23:17    Oren Binet, MD  [facility] Pager:[phone]  If 7PM-7AM, please contact night-coverage [email or web address] Password [email or web address] 08/14/2014, 3:51 PM   LOS: 2 days

## 2014-08-14 NOTE — Consult Note (Signed)
Referral MD  Reason for Referral: Recurrent iron deficiency anemia secondary to colonic mass and anticoagulation   Chief Complaint  Patient presents with  . Abdominal Pain  : She cannot give any history  HPI: Cheryl Larson is a very charming 81 her old white female who we know very well. We have seen her in the office on several occasions. We have given her IV iron.  She subsequently was found to have a mass in the colon. This was at the hepatic flexure.  She was at [facility]. She is now transferred down to [place]. She has been evaluated by surgery. She has been cleared by cardiology. She is going to surgery today.  She has always responded to IV iron. When I saw her back in February, her hemoglobin was down to 7.5 . she did get iron and 2 units of blood. Prior to that, she was seen in December 2013.  She subsequently was found to have a mass in the colon. She had a CT scan. She had a colonoscopy. Overall, she looks pretty good. She is lost a little bit of weight. She's not having any pain. There is no obvious bleeding. She has no cough. A chest x-ray done on March 7 showed some cardiomegaly.    Past Medical History  Diagnosis Date  . Thyroid disease   . Hypertension   . A-fib   . Blood transfusion without reported diagnosis   :  Past Surgical History  Procedure Laterality Date  . Cholecystectomy    :   Current facility-administered medications:  .  [MAR Hold] 0.9 %  sodium chloride infusion, 250 mL, Intravenous, PRN, Phillips Grout, MD .  dextrose 5 % and 0.9 % NaCl with KCl 20 mEq/L infusion, , Intravenous, Continuous, Rexene Alberts, MD, Last Rate: 75 mL/hr at 08/13/14 2219 .  [MAR Hold] famotidine (PEPCID) IVPB 20 mg, 20 mg, Intravenous, Q24H, Rexene Alberts, MD, 20 mg at 08/13/14 1508 .  fentaNYL (SUBLIMAZE) 0.05 MG/ML injection, , , ,  .  fentaNYL (SUBLIMAZE) 0.05 MG/ML injection, , , ,  .  fentaNYL (SUBLIMAZE) injection 25-50 mcg, 25-50 mcg, Intravenous, Q5  min PRN, Suzette Battiest, MD, 25 mcg at 08/14/14 1404 .  [MAR Hold] HYDROmorphone (DILAUDID) injection 1 mg, 1 mg, Intravenous, Q4H PRN, Phillips Grout, MD, 0.5 mg at 08/13/14 2310 .  lactated ringers infusion, , Intravenous, Continuous, Md Ccs, MD, Last Rate: 10 mL/hr at 08/14/14 1247 .  [MAR Hold] metoprolol (LOPRESSOR) injection 2.5 mg, 2.5 mg, Intravenous, Q12H, Jonetta Osgood, MD, 2.5 mg at 08/13/14 1513 .  ondansetron (ZOFRAN) 4 MG/2ML injection, , , ,  .  [MAR Hold] ondansetron (ZOFRAN) tablet 4 mg, 4 mg, Oral, Q6H PRN **OR** [MAR Hold] ondansetron (ZOFRAN) injection 4 mg, 4 mg, Intravenous, Q6H PRN, Phillips Grout, MD, 4 mg at 08/12/14 2033 .  [MAR Hold] sodium chloride 0.9 % injection 3 mL, 3 mL, Intravenous, Q12H, Phillips Grout, MD, 3 mL at 08/13/14 1000 .  [MAR Hold] sodium chloride 0.9 % injection 3 mL, 3 mL, Intravenous, PRN, Phillips Grout, MD:  . [MAR Hold] famotidine (PEPCID) IV  20 mg Intravenous Q24H  . fentaNYL      . fentaNYL      . [MAR Hold] metoprolol  2.5 mg Intravenous Q12H  . ondansetron      . [MAR Hold] sodium chloride  3 mL Intravenous Q12H  :  Allergies  Allergen Reactions  . Sulfa Antibiotics Hives  :  Family History  Problem Relation Age of Onset  . Colon cancer Neg Hx   :  History   Social History  . Marital Status: Widowed    Spouse Name: N/A  . Number of Children: N/A  . Years of Education: N/A   Occupational History  . Not on file.   Social History Main Topics  . Smoking status: Never Smoker   . Smokeless tobacco: Never Used     Comment: NEVER USED TOBACCO  . Alcohol Use: No  . Drug Use: No  . Sexual Activity: Not on file   Other Topics Concern  . Not on file   Social History Narrative  :  Pertinent items are noted in HPI.  Exam: Patient Vitals for the past 24 hrs:  BP Temp Temp src Pulse Resp SpO2  08/14/14 1404 - - - 79 19 100 %  08/14/14 1330 - - - 76 16 100 %  08/14/14 1322 (!) 143/67 mmHg - - - - -  08/14/14  1311 - - - 75 20 100 %  08/14/14 1307 (!) 145/77 mmHg - - - - -  08/14/14 1300 - - - 75 20 100 %  08/14/14 1252 (!) 153/73 mmHg - - - - -  08/14/14 1245 - - - 79 (!) 23 97 %  08/14/14 1237 (!) 141/72 mmHg - - - - -  08/14/14 1230 - - - 71 (!) 24 99 %  08/14/14 1222 (!) 148/81 mmHg - - - - -  08/14/14 1215 - - - 71 (!) 24 99 %  08/14/14 1207 (!) 147/65 mmHg - - - - -  08/14/14 1200 - 97.7 F (36.5 C) - 69 (!) 29 96 %  08/14/14 1153 (!) 145/50 mmHg - - - - -  08/14/14 0749 (!) 145/75 mmHg 97.7 F (36.5 C) Oral 76 18 100 %  08/14/14 0559 134/76 mmHg 97.8 F (36.6 C) Oral 75 16 96 %  08/13/14 2114 115/64 mmHg 98.6 F (37 C) Oral 66 17 99 %  08/13/14 1500 120/64 mmHg - - 70 - -   well developed, thin elderly white female. Her vital signs show temperature of 97.7. Pulse 69. Blood pressure 137 or 71. Lungs are clear. There may be some slight decrease at the bases. Cardiac exam regular rate and rhythm with no murmurs, rubs or bruits. Abdomen is soft. She has no distention. His no guarding or rebound tenderness. There is no palpable liver or spleen tip. Extremities shows no clubbing, cyanosis or edema.    Recent Labs  08/11/14 2130  WBC 14.5*  HGB 10.1*  HCT 32.5*  PLT 360    Recent Labs  08/11/14 1532 08/11/14 2130  NA  --  133*  K  --  4.1  CL  --  100  CO2  --  24  GLUCOSE  --  114*  BUN  --  23  CREATININE 1.00 0.95  CALCIUM  --  9.6    Blood smear review: none   Pathology:none     Assessment and Plan:  Cheryl Larson is a nice 81 year old white female. The source of her recurrent iron deficiency is now obvious. She has a colonic mass.  Despite her advanced age, she is in good shape. I know that the surgeons are very good and that they will get her through surgery. I think this is going to be the only way for [place] to alleviate the iron deficiency issue.  She also is at risk for  obstruction and removing the tumor will also improve that issue.  At her age, there is  absolute no indication for adjuvant therapy. Even if she has positive lymph nodes, I would not offer adjuvant therapy. Even single agent Xeloda I think would be difficult for her to manage.  I think we still have to focus on the quality of life and the anemia. Again, once the tumors removed, I would think that she should be able to maintain her red cell count.  As always, it is nice to see her. I am grateful for the incredible care that she is getting. Everybody involved with her care has really done a great job in trying to help her and trying to improve her quality of life.  Pete E.  Romans 1:16

## 2014-08-14 NOTE — Anesthesia Procedure Notes (Signed)
Procedure Name: Intubation Date/Time: 08/14/2014 9:27 AM Performed by: Willeen Cass P Pre-anesthesia Checklist: Patient identified, Timeout performed, Emergency Drugs available, Suction available and Patient being monitored Patient Re-evaluated:Patient Re-evaluated prior to inductionOxygen Delivery Method: Circle system utilized Preoxygenation: Pre-oxygenation with 100% oxygen Intubation Type: IV induction Ventilation: Mask ventilation without difficulty Laryngoscope Size: Mac and 3 Grade View: Grade I Tube type: Oral Tube size: 7.5 mm Number of attempts: 1 Airway Equipment and Method: Stylet Placement Confirmation: ETT inserted through vocal cords under direct vision,  breath sounds checked- equal and bilateral and positive ETCO2 Secured at: 21 cm Tube secured with: Tape Dental Injury: Teeth and Oropharynx as per pre-operative assessment

## 2014-08-15 ENCOUNTER — Other Ambulatory Visit: Payer: Medicare Other | Admitting: Lab

## 2014-08-15 ENCOUNTER — Ambulatory Visit: Payer: Medicare Other | Admitting: Hematology & Oncology

## 2014-08-15 LAB — BASIC METABOLIC PANEL
Anion gap: 6 (ref 5–15)
BUN: 11 mg/dL (ref 6–23)
CALCIUM: 8.4 mg/dL (ref 8.4–10.5)
CO2: 23 mmol/L (ref 19–32)
Chloride: 110 mmol/L (ref 96–112)
Creatinine, Ser: 1.21 mg/dL — ABNORMAL HIGH (ref 0.50–1.10)
GFR calc non Af Amer: 37 mL/min — ABNORMAL LOW (ref >90)
GFR, EST AFRICAN AMERICAN: 42 mL/min — AB (ref >90)
GLUCOSE: 142 mg/dL — AB (ref 70–99)
Potassium: 5.4 mmol/L — ABNORMAL HIGH (ref 3.5–5.1)
SODIUM: 139 mmol/L (ref 135–145)

## 2014-08-15 LAB — CBC
HCT: 32.4 % — ABNORMAL LOW (ref 36.0–46.0)
Hemoglobin: 10 g/dL — ABNORMAL LOW (ref 12.0–15.0)
MCH: 25.6 pg — ABNORMAL LOW (ref 26.0–34.0)
MCHC: 30.9 g/dL (ref 30.0–36.0)
MCV: 83.1 fL (ref 78.0–100.0)
Platelets: 332 10*3/uL (ref 150–400)
RBC: 3.9 MIL/uL (ref 3.87–5.11)
RDW: 20 % — AB (ref 11.5–15.5)
WBC: 11.8 10*3/uL — ABNORMAL HIGH (ref 4.0–10.5)

## 2014-08-15 MED ORDER — HYDROCODONE-ACETAMINOPHEN 5-325 MG PO TABS
1.0000 | ORAL_TABLET | ORAL | Status: DC | PRN
Start: 2014-08-15 — End: 2014-08-25
  Administered 2014-08-15: 2 via ORAL
  Administered 2014-08-15: 1 via ORAL
  Administered 2014-08-15 – 2014-08-16 (×2): 2 via ORAL
  Administered 2014-08-16: 1 via ORAL
  Administered 2014-08-19 – 2014-08-22 (×3): 2 via ORAL
  Filled 2014-08-15 (×2): qty 2
  Filled 2014-08-15: qty 1
  Filled 2014-08-15 (×6): qty 2

## 2014-08-15 MED ORDER — CETYLPYRIDINIUM CHLORIDE 0.05 % MT LIQD
7.0000 mL | Freq: Two times a day (BID) | OROMUCOSAL | Status: DC
  Administered 2014-08-15 – 2014-08-17 (×3): 7 mL via OROMUCOSAL

## 2014-08-15 MED ORDER — SODIUM POLYSTYRENE SULFONATE 15 GM/60ML PO SUSP
15.0000 g | Freq: Once | ORAL | Status: AC
  Administered 2014-08-15: 15 g via RECTAL
  Filled 2014-08-15: qty 60

## 2014-08-15 MED ORDER — CHLORHEXIDINE GLUCONATE 0.12 % MT SOLN
15.0000 mL | Freq: Two times a day (BID) | OROMUCOSAL | Status: DC
  Administered 2014-08-15 – 2014-08-17 (×4): 15 mL via OROMUCOSAL
  Filled 2014-08-15 (×6): qty 15

## 2014-08-15 MED ORDER — SODIUM CHLORIDE 0.9 % IV BOLUS (SEPSIS)
500.0000 mL | Freq: Once | INTRAVENOUS | Status: AC
  Administered 2014-08-15: 500 mL via INTRAVENOUS

## 2014-08-15 MED ORDER — DEXTROSE-NACL 5-0.9 % IV SOLN
INTRAVENOUS | Status: DC
  Administered 2014-08-15 – 2014-08-19 (×7): via INTRAVENOUS

## 2014-08-15 MED ORDER — DEXTROSE 5 % IV SOLN
INTRAVENOUS | Status: DC
  Administered 2014-08-15: 08:00:00 via INTRAVENOUS

## 2014-08-15 NOTE — Evaluation (Addendum)
Physical Therapy Evaluation Patient Details Name: Cheryl Larson MRN: [id number] DOB: May 15, 1916 Today's Date: 08/15/2014   History of Present Illness  Pt admit with colon mass.  Right Hemicolectomy performed.   Clinical Impression  Pt admitted with above diagnosis. Pt currently with functional limitations due to the deficits listed below (see PT Problem List). Pt weak and will need 24 hour care at home.  Pt states she can hire someone but family works.  Will need to clarify this and can do HHPT if she has 24 hour care.  If she doesn't line up 24 hour care, will need short term therapy at a NH.  Pt limited by pain today but was able to ambulate a short distance.  Will follow acutely.  Pt will benefit from skilled PT to increase their independence and safety with mobility to allow discharge to the venue listed below.      Follow Up Recommendations Home health PT;Supervision/Assistance - 24 hour (pt states she can hire someone initially)    Equipment Recommendations  3in1 (PT)    Recommendations for Other Services       Precautions / Restrictions Precautions Precautions: Fall Restrictions Weight Bearing Restrictions: No      Mobility  Bed Mobility Overal bed mobility: Needs Assistance;+2 for physical assistance Bed Mobility: Sit to Supine       Sit to supine: Mod assist   General bed mobility comments: Pt assisted to supine with mod assist for LEs and to control trunk.   Transfers Overall transfer level: Needs assistance Equipment used: Rolling walker (2 wheeled) Transfers: Sit to/from Stand Sit to Stand: Min assist         General transfer comment: Needed min assist to power up.   Ambulation/Gait Ambulation/Gait assistance: Min assist;Mod assist Ambulation Distance (Feet): 15 Feet Assistive device: Rolling walker (2 wheeled) Gait Pattern/deviations: Step-through pattern;Decreased stride length;Trunk flexed;Antalgic   Gait velocity interpretation: Below normal  speed for age/gender General Gait Details: Pt with flexed posture.  Pt takes small steps and needed cues for walker sequencing and step technique.  Fatigues quickly.  Poor safety awareness with RW overall.    Stairs            Wheelchair Mobility    Modified Rankin (Stroke Patients Only)       Balance Overall balance assessment: Needs assistance;History of Falls Sitting-balance support: No upper extremity supported;Feet supported Sitting balance-Leahy Scale: Fair     Standing balance support: Bilateral upper extremity supported;During functional activity Standing balance-Leahy Scale: Poor Standing balance comment: requires UE support for balance.                               Pertinent Vitals/Pain Pain Assessment: Faces Faces Pain Scale: Hurts little more Pain Location: abdomen incision site Pain Descriptors / Indicators: Aching;Sore Pain Intervention(s): Premedicated before session;Monitored during session;Limited activity within patient's tolerance;Repositioned  VSS    Home Living Family/patient expects to be discharged to:: Private residence Living Arrangements: Alone Available Help at Discharge: Family;Available PRN/intermittently (staying with daughter last 5 weeks) Type of Home: House Home Access: Stairs to enter Entrance Stairs-Rails: Left Entrance Stairs-Number of Steps: 5 Home Layout: Two level;Full bath on main level (daughter has bedroom on bottom level) Home Equipment: Walker - 2 wheels      Prior Function Level of Independence: Independent               Hand Dominance   Dominant Hand: Right  Extremity/Trunk Assessment   Upper Extremity Assessment: Defer to OT evaluation           Lower Extremity Assessment: Generalized weakness      Cervical / Trunk Assessment: Kyphotic  Communication   Communication: No difficulties  Cognition Arousal/Alertness: Awake/alert Behavior During Therapy: WFL for tasks  assessed/performed Overall Cognitive Status: Within Functional Limits for tasks assessed                      General Comments      Exercises        Assessment/Plan    PT Assessment Patient needs continued PT services  PT Diagnosis Generalized weakness;Acute pain   PT Problem List Decreased strength;Decreased activity tolerance;Pain;Decreased safety awareness;Decreased knowledge of use of DME;Decreased mobility;Decreased balance  PT Treatment Interventions DME instruction;Gait training;Stair training;Functional mobility training;Therapeutic activities;Therapeutic exercise;Balance training;Patient/family education   PT Goals (Current goals can be found in the Care Plan section) Acute Rehab PT Goals Patient Stated Goal: to go home PT Goal Formulation: With patient Time For Goal Achievement: 08/22/14 Potential to Achieve Goals: Good    Frequency Min 3X/week   Barriers to discharge Decreased caregiver support family works    Co-evaluation               End of Glass blower/designer During Treatment: Gait belt Activity Tolerance: Patient limited by fatigue;Patient limited by pain Patient left: in bed;with call bell/phone within reach Nurse Communication: Mobility status         Time: 1419-1446 PT Time Calculation (min) (ACUTE ONLY): 27 min   Charges:   PT Evaluation $Initial PT Evaluation Tier I: 1 Procedure PT Treatments $Gait Training: 8-22 mins   PT G CodesDenice Paradise 2014-08-27, 3:51 PM [employer] Acute Rehabilitation [phone] (pager)

## 2014-08-15 NOTE — Progress Notes (Signed)
PATIENT DETAILS Name: Cheryl Larson Age: 81 y.o. Sex: female Date of Birth: 05-Mar-1916 Admit Date: 08/11/2014 Admitting Physician Florencia Reasons, MD GFQ:[id number],YOFVWA, MD  Subjective: No major events overnight.  Assessment/Plan: Principal Problem:   Colonic mass: Admitted and seen in consultation by general surgery.Underwent right hemicolectomy on 3/10. Gen surgery following-defer post op care to Gen Surgery. Suspect again be transferred back to [facility].  Active Problems:    History of atrial fibrillation: Rate controlled,previously on coumadin. CHADS2VASC score of 4-suspect no longer a anticoagulation given advanced age,frailty and nowj ust post surgery. Continue intravenous metoprolol. Follow.    Hypothyroidism: Continue levothyroxine    Anemia: Likely secondary to colonic mass/chronic illness. Follow CBC-hemoglobin remained stable    Large splenic infarct: Seen on CT scan. Not a candidate for long-term anticoagulation. Seems to be asymptomatic without any abdominal pain  Disposition: Remain inpatient-suspect may require SNF on discharge.   Antibiotics:  See below   Anti-infectives    Start     Dose/Rate Route Frequency Ordered Stop   08/14/14 0830  [MAR Hold]  cefoTEtan (CEFOTAN) 2 g in dextrose 5 % 50 mL IVPB     (MAR Hold since 08/14/14 0818)  Comments:  Pharmacy may adjust dose strength for optimal dosing.   Send with patient on call to the OR.  Anesthesia to complete antibiotic administration <26min prior to incision per [facility].   2 g 100 mL/hr over 30 Minutes Intravenous On call to O.R. 08/14/14 0756 08/14/14 0930      DVT Prophylaxis:  SCD's  Code Status: Full code  Family Communication None at bedside  Procedures:  None  CONSULTS:  cardiology and general surgery  MEDICATIONS: Scheduled Meds: . antiseptic oral rinse  7 mL Mouth Rinse q12n4p  . chlorhexidine  15 mL Mouth Rinse BID  . famotidine (PEPCID) IV  20 mg Intravenous Q24H    . levothyroxine  44 mcg Intravenous Daily  . metoprolol  2.5 mg Intravenous Q12H  . sodium chloride  3 mL Intravenous Q12H   Continuous Infusions: . dextrose 75 mL/hr at 08/15/14 0745   PRN Meds:.sodium chloride, acetaminophen, HYDROcodone-acetaminophen, morphine injection, ondansetron **OR** ondansetron (ZOFRAN) IV, sodium chloride    PHYSICAL EXAM: Vital signs in last 24 hours: Filed Vitals:   08/15/14 0000 08/15/14 0505 08/15/14 0739 08/15/14 1139  BP:  124/74 120/70 126/80  Pulse: 108 98 115 97  Temp:  98.2 F (36.8 C) 98.2 F (36.8 C) 98.9 F (37.2 C)  TempSrc:  Oral Oral Oral  Resp: 17 24 35 19  Height:      Weight:      SpO2: 92% 96% 94% 95%    Weight change:  Filed Weights   08/12/14 0124 08/12/14 2248 08/14/14 1752  Weight: 55.974 kg (123 lb 6.4 oz) 58.605 kg (129 lb 3.2 oz) 60.1 kg (132 lb 7.9 oz)   Body mass index is 24.23 kg/(m^2).   Gen Exam: Awake and alert with clear speech.   Neck: Supple, No JVD.   Chest: B/L Clear.  No rales or rhonchi CVS: S1 S2 irregular, no murmurs.  Abdomen: soft, BS +,Appropriately tender-dressing in place-no soakage Extremities: no edema, lower extremities warm to touch. Neurologic: Non Focal.   Skin: No Rash.   Wounds: N/A.   Intake/Output from previous day:  Intake/Output Summary (Last 24 hours) at 08/15/14 1310 Last data filed at 08/15/14 1141  Gross per 24 hour  Intake 2690.67 ml  Output  410 ml  Net 2280.67 ml     LAB RESULTS: CBC  Recent Labs Lab 08/11/14 2130 08/15/14 0227  WBC 14.5* 11.8*  HGB 10.1* 10.0*  HCT 32.5* 32.4*  PLT 360 332  MCV 83.3 83.1  MCH 25.9* 25.6*  MCHC 31.1 30.9  RDW 19.2* 20.0*  LYMPHSABS 2.0  --   MONOABS 1.8*  --   EOSABS 0.0  --   BASOSABS 0.0  --     Chemistries   Recent Labs Lab 08/11/14 1532 08/11/14 2130 08/15/14 0227  NA  --  133* 139  K  --  4.1 5.4*  CL  --  100 110  CO2  --  24 23  GLUCOSE  --  114* 142*  BUN  --  23 11  CREATININE 1.00 0.95  1.21*  CALCIUM  --  9.6 8.4    CBG: No results for input(s): GLUCAP in the last 168 hours.  GFR Estimated Creatinine Clearance: 21.5 mL/min (by C-G formula based on Cr of 1.21).  Coagulation profile No results for input(s): INR, PROTIME in the last 168 hours.  Cardiac Enzymes No results for input(s): CKMB, TROPONINI, MYOGLOBIN in the last 168 hours.  Invalid input(s): CK  Invalid input(s): POCBNP No results for input(s): DDIMER in the last 72 hours. No results for input(s): HGBA1C in the last 72 hours. No results for input(s): CHOL, HDL, LDLCALC, TRIG, CHOLHDL, LDLDIRECT in the last 72 hours.  Recent Labs  08/14/14 0650  TSH 3.632   No results for input(s): VITAMINB12, FOLATE, FERRITIN, TIBC, IRON, RETICCTPCT in the last 72 hours. No results for input(s): LIPASE, AMYLASE in the last 72 hours.  Urine Studies No results for input(s): UHGB, CRYS in the last 72 hours.  Invalid input(s): UACOL, UAPR, USPG, UPH, UTP, UGL, UKET, UBIL, UNIT, UROB, ULEU, UEPI, UWBC, URBC, UBAC, CAST, UCOM, BILUA  MICROBIOLOGY: Recent Results (from the past 240 hour(s))  Clostridium Difficile by PCR     Status: None   Collection Time: 08/13/14  6:11 PM  Result Value Ref Range Status   C difficile by pcr NEGATIVE NEGATIVE Final  Surgical pcr screen     Status: None   Collection Time: 08/13/14  7:19 PM  Result Value Ref Range Status   MRSA, PCR NEGATIVE NEGATIVE Final   Staphylococcus aureus NEGATIVE NEGATIVE Final    Comment:        The Xpert SA Assay (FDA approved for NASAL specimens in patients over 61 years of age), is one component of a comprehensive surveillance program.  Test performance has been validated by [facility] for patients greater than or equal to 42 year old. It is not intended to diagnose infection nor to guide or monitor treatment.     RADIOLOGY STUDIES/RESULTS: Dg Abd 1 View  08/04/2014   CLINICAL DATA:  Right lower quadrant palpable abnormality  EXAM: ABDOMEN  - 1 VIEW  COMPARISON:  None.  FINDINGS: Scattered large and small bowel gas is noted. Fecal material is noted throughout the colon. Degenerative changes of lumbar spine with a scoliosis concave to the left are seen. Mild osteophytic changes are noted. No abnormal mass is identified.  IMPRESSION: No abnormality to correspond with the patient's history.   Electronically Signed   By: Inez Catalina M.D.   On: 08/04/2014 16:54   Ct Abdomen Pelvis W Contrast  08/11/2014   CLINICAL DATA:  RIGHT lower quadrant abdominal pain, feels like she has a mass in that area, 60 pound weight loss  over 3 years, occult GI bleeding, hypertension, atrial fibrillation  EXAM: CT ABDOMEN AND PELVIS WITH CONTRAST  TECHNIQUE: Multidetector CT imaging of the abdomen and pelvis was performed using the standard protocol following bolus administration of intravenous contrast. Sagittal and coronal MPR images reconstructed from axial data set.  CONTRAST:  182mL OMNIPAQUE IOHEXOL 300 MG/ML SOLN IV. Dilute oral contrast.  COMPARISON:  None  FINDINGS: Small bibasilar pleural effusions and atelectasis.  Large area of absent enhancement within the spleen consistent with a large splenic infarct.  No splenic vein is identified though perisplenic collaterals are seen.  Small splenule adjacent to splenic hilum.  Atrophic pancreas with CBD dilatation and prior cholecystectomy.  Liver, kidneys, and adrenal glands normal.  Large mass at hepatic flexure of colon measuring 9.6 x 6.3 x 6.3 cm in size consistent with a colonic neoplasm.  Mild infiltrative changes of the fat surrounding the mass are seen which could be related to edema, ascites, or tumor extension.  Mass obstructs the RIGHT colon, with dilatation of the ascending colon and cecum as well as of multiple small bowel loops.  Transverse colon decompressed.  Diverticulosis of descending and sigmoid colon without evidence of diverticulitis.  Moderate sized hiatal hernia.  Remainder of stomach  unremarkable.  Atherosclerotic changes at aorta and coronary arteries.  No additional mass, adenopathy, or free air.  Minimal perihepatic and pelvic ascites.  Bones demineralized with scattered degenerative disc and facet disease changes of the thoracolumbar spine.  IMPRESSION: Large mass at hepatic flexure of colon measuring 9.6 x 6.3 x 6.3 cm in size consistent with a colonic neoplasm, causing colonic and small bowel obstruction.  Minimal peritumoral infiltration could be related to edema/ascites though tumor extension is not excluded.  Minimal ascites.  Atrophic pancreas.  Additionally, large splenic infarct with nonvisualization of the splenic vein and note of scattered collaterals in the perisplenic region.  Findings called to [employer] on 08/11/2014 at 1613 hours.  Findings also discussed with patient and her daughter.   Electronically Signed   By: Lavonia Dana M.D.   On: 08/11/2014 16:37   Dg Chest Port 1 View  08/11/2014   CLINICAL DATA:  Low abdominal pain since October 2015, recent diagnosis of abdominal tumor. Loss of appetite, intermittent diarrhea and constipation. Weakness.  EXAM: PORTABLE CHEST - 1 VIEW  COMPARISON:  None.  FINDINGS: The cardiac silhouette appears mild to moderately enlarged. Mediastinal silhouette is nonsuspicious. Large hiatal hernia. Bold bibasilar strandy densities, mildly elevated LEFT hemidiaphragm. Skin folds project in LEFT chest. No pleural effusion or focal consolidation. Gaseous distended esophagus. Soft tissue planes and included osseous structures are nonsuspicious.  IMPRESSION: Mild to moderate cardiomegaly.  Bibasilar atelectasis.  Large hiatal hernia.   Electronically Signed   By: Elon Alas   On: 08/11/2014 23:17    Oren Binet, MD  [facility] Pager:[phone]  If 7PM-7AM, please contact night-coverage [email or web address] Password [email or web address] 08/15/2014, 1:10 PM   LOS: 3 days

## 2014-08-15 NOTE — Anesthesia Postprocedure Evaluation (Signed)
  Anesthesia Post-op Note  Patient: Cheryl Larson  Procedure(s) Performed: Procedure(s): RIGHT HEMI COLECTOMY (Right)  Patient Location: PACU  Anesthesia Type:General  Level of Consciousness: awake and alert   Airway and Oxygen Therapy: Patient Spontanous Breathing  Post-op Pain: none  Post-op Assessment: Post-op Vital signs reviewed  Post-op Vital Signs: Reviewed  Last Vitals:  Filed Vitals:   08/15/14 1139  BP: 126/80  Pulse: 97  Temp: 37.2 C  Resp: 19    Complications: No apparent anesthesia complications

## 2014-08-15 NOTE — Progress Notes (Signed)
Medicare Important Message given? YES (If response is "NO", the following Medicare IM given date fields will be blank) Date Medicare IM given:08/15/2014 Medicare IM given by: Whitman Hero

## 2014-08-15 NOTE — Progress Notes (Signed)
Patient ID: Cheryl Larson, female   DOB: Oct 26, 1915, 81 y.o.   MRN: [id number]     [facility]      [address] [place], [place] [address]    Phone: [phone] FAX: [phone]     Subjective: Burping.  No n/v.  No flatus.  4/10 pain, takes tramadol at home for CBP.    Objective:  Vital signs:  Filed Vitals:   08/14/14 2315 08/15/14 0000 08/15/14 0505 08/15/14 0739  BP: 126/71  124/74 120/70  Pulse: 101 108 98 115  Temp: 98.3 F (36.8 C)  98.2 F (36.8 C) 98.2 F (36.8 C)  TempSrc: Oral  Oral Oral  Resp: 22 17 24  35  Height:      Weight:      SpO2: 92% 92% 96% 94%    Last BM Date: 08/13/14  Intake/Output   Yesterday:  03/10 0701 - 03/11 0700 In: 3474.4 [P.O.:180; I.V.:3294.4] Out: 525 [Urine:475; Blood:50] This shift: I/O last 3 completed shifts: In: 4623.2 [P.O.:180; I.V.:4443.2] Out: 725 [Urine:675; Blood:50]     Physical Exam: General: Pt awake/alert/oriented x4 in no acute distress Chest: cta.  No chest wall pain w good excursion CV:  S1S2 irregularly irregular rate and rhythm.  2/2 pulses, no edema.  Abdomen: Soft.  Nondistended. Mild TTP.  Midline incision-dry dressing.  Hypoactive bs.  No evidence of peritonitis.  No incarcerated hernias. Ext:  SCDs BLE.  No mjr edema.  No cyanosis Skin: No petechiae / purpura   Problem List:   Principal Problem:   Colonic mass Active Problems:   Anemia, iron deficiency   Essential hypertension   Hypothyroidism   Chronic atrial fibrillation   Partial small bowel obstruction   Abdominal mass, RUQ (right upper quadrant)    Results:   Labs: Results for orders placed or performed during the hospital encounter of 08/11/14 (from the past 36 hour(s))  Clostridium Difficile by PCR     Status: None   Collection Time: 08/13/14  6:11 PM  Result Value Ref Range   C difficile by pcr NEGATIVE NEGATIVE  Surgical pcr screen     Status: None   Collection Time:  08/13/14  7:19 PM  Result Value Ref Range   MRSA, PCR NEGATIVE NEGATIVE   Staphylococcus aureus NEGATIVE NEGATIVE    Comment:        The Xpert SA Assay (FDA approved for NASAL specimens in patients over 22 years of age), is one component of a comprehensive surveillance program.  Test performance has been validated by [facility] for patients greater than or equal to 62 year old. It is not intended to diagnose infection nor to guide or monitor treatment.   TSH     Status: None   Collection Time: 08/14/14  6:50 AM  Result Value Ref Range   TSH 3.632 0.350 - 4.500 uIU/mL  CBC     Status: Abnormal   Collection Time: 08/15/14  2:27 AM  Result Value Ref Range   WBC 11.8 (H) 4.0 - 10.5 K/uL   RBC 3.90 3.87 - 5.11 MIL/uL   Hemoglobin 10.0 (L) 12.0 - 15.0 g/dL   HCT 32.4 (L) 36.0 - 46.0 %   MCV 83.1 78.0 - 100.0 fL   MCH 25.6 (L) 26.0 - 34.0 pg   MCHC 30.9 30.0 - 36.0 g/dL   RDW 20.0 (H) 11.5 - 15.5 %   Platelets 332 150 - 400 K/uL  Basic metabolic panel  Status: Abnormal   Collection Time: 08/15/14  2:27 AM  Result Value Ref Range   Sodium 139 135 - 145 mmol/L   Potassium 5.4 (H) 3.5 - 5.1 mmol/L   Chloride 110 96 - 112 mmol/L   CO2 23 19 - 32 mmol/L   Glucose, Bld 142 (H) 70 - 99 mg/dL   BUN 11 6 - 23 mg/dL   Creatinine, Ser 1.21 (H) 0.50 - 1.10 mg/dL   Calcium 8.4 8.4 - 10.5 mg/dL   GFR calc non Af Amer 37 (L) >90 mL/min   GFR calc Af Amer 42 (L) >90 mL/min    Comment: (NOTE) The eGFR has been calculated using the CKD EPI equation. This calculation has not been validated in all clinical situations. eGFR's persistently <90 mL/min signify possible Chronic Kidney Disease.    Anion gap 6 5 - 15    Imaging / Studies: No results found.  Medications / Allergies:  Scheduled Meds: . antiseptic oral rinse  7 mL Mouth Rinse q12n4p  . chlorhexidine  15 mL Mouth Rinse BID  . famotidine (PEPCID) IV  20 mg Intravenous Q24H  . levothyroxine  44 mcg Intravenous Daily  .  metoprolol  2.5 mg Intravenous Q12H  . sodium chloride  3 mL Intravenous Q12H   Continuous Infusions: . dextrose 75 mL/hr at 08/15/14 0745   PRN Meds:.sodium chloride, acetaminophen, HYDROcodone-acetaminophen, morphine injection, ondansetron **OR** ondansetron (ZOFRAN) IV, sodium chloride  Antibiotics: Anti-infectives    Start     Dose/Rate Route Frequency Ordered Stop   08/14/14 0830  [MAR Hold]  cefoTEtan (CEFOTAN) 2 g in dextrose 5 % 50 mL IVPB     (MAR Hold since 08/14/14 0818)  Comments:  Pharmacy may adjust dose strength for optimal dosing.   Send with patient on call to the OR.  [facility]est[facility] antibiotic [facility]81mn [facility]   2 g 100 mL/hr over 30 Minutes Intravenous On call to O.R. 08/14/14 0756 08/14/14 0930        Assessment/Plan Large right colon mass with partial stricture POD#1 right hemicolectomy ---Dr. TGeorgette Dover-ice chips -await bowel function -DC dilaudid, c/w morphine.  DC tramadol and add norco(pt takes tramadol for chronic back pain) -mobilize -DC foley -IS  -SCD AKI -give 500cc fluid bolus(2D echo okay) -monitor I&Os -repeat labs in AM  EErby Pian [facility] Pager [phone](7A-4:30P) For consults and floor pages call [phone](7A-4:30P)  08/15/2014  8:31 AM

## 2014-08-16 LAB — BASIC METABOLIC PANEL
Anion gap: 8 (ref 5–15)
BUN: 12 mg/dL (ref 6–23)
CALCIUM: 8.4 mg/dL (ref 8.4–10.5)
CHLORIDE: 111 mmol/L (ref 96–112)
CO2: 19 mmol/L (ref 19–32)
CREATININE: 1.03 mg/dL (ref 0.50–1.10)
GFR, EST AFRICAN AMERICAN: 51 mL/min — AB (ref >90)
GFR, EST NON AFRICAN AMERICAN: 44 mL/min — AB (ref >90)
Glucose, Bld: 135 mg/dL — ABNORMAL HIGH (ref 70–99)
Potassium: 3.9 mmol/L (ref 3.5–5.1)
Sodium: 138 mmol/L (ref 135–145)

## 2014-08-16 MED ORDER — HEPARIN SODIUM (PORCINE) 5000 UNIT/ML IJ SOLN
5000.0000 [IU] | Freq: Three times a day (TID) | INTRAMUSCULAR | Status: DC
  Administered 2014-08-16 – 2014-08-25 (×27): 5000 [IU] via SUBCUTANEOUS
  Filled 2014-08-16 (×22): qty 1

## 2014-08-16 NOTE — Progress Notes (Signed)
Patient ID: Cheryl Larson, female   DOB: 15-Sep-1915, 81 y.o.   MRN: [id number]     [facility]      [address] [place], [place] [address]    Phone: [phone] FAX: [phone]     Subjective: Nausea overnight.  No flatus.  VSS.  Afebrile. Minimal pain.   Objective:  Vital signs:  Filed Vitals:   08/15/14 1139 08/15/14 1611 08/15/14 2117 08/16/14 0534  BP: 126/80 149/77 147/83 152/82  Pulse: 97 92 82 88  Temp: 98.9 F (37.2 C) 98.8 F (37.1 C) 97.3 F (36.3 C) 98 F (36.7 C)  TempSrc: Oral Oral Oral Oral  Resp: 19 18  18   Height:      Weight:      SpO2: 95% 100% 99% 96%    Last BM Date: 08/13/14  Intake/Output   Yesterday:  03/11 0701 - 03/12 0700 In: 1792.5 [P.O.:150; I.V.:1092.5; IV Piggyback:550] Out: 135 [Urine:135] This shift:    I/O last 3 completed shifts: In: 2722.5 [P.O.:330; I.V.:1842.5; IV Piggyback:550] Out: 360 [Urine:360]     Physical Exam: General: Pt awake/alert/oriented x4 in no acute distress Chest: cta. No chest wall pain w good excursion CV: S1S2 irregularly irregular rate and rhythm. 2/2 pulses, no edema.  Abdomen: Soft. Nondistended. Mild TTP. Midline incision-dry dressing. +BS. No evidence of peritonitis. No incarcerated hernias. Ext: SCDs BLE. No mjr edema. No cyanosis Skin: No petechiae / purpura  Problem List:   Principal Problem:   Colonic mass Active Problems:   Anemia, iron deficiency   Essential hypertension   Hypothyroidism   Chronic atrial fibrillation   Partial small bowel obstruction   Abdominal mass, RUQ (right upper quadrant)    Results:   Labs: Results for orders placed or performed during the hospital encounter of 08/11/14 (from the past 48 hour(s))  CBC     Status: Abnormal   Collection Time: 08/15/14  2:27 AM  Result Value Ref Range   WBC 11.8 (H) 4.0 - 10.5 K/uL   RBC 3.90 3.87 - 5.11 MIL/uL   Hemoglobin 10.0 (L) 12.0 - 15.0 g/dL   HCT  32.4 (L) 36.0 - 46.0 %   MCV 83.1 78.0 - 100.0 fL   MCH 25.6 (L) 26.0 - 34.0 pg   MCHC 30.9 30.0 - 36.0 g/dL   RDW 20.0 (H) 11.5 - 15.5 %   Platelets 332 150 - 400 K/uL  Basic metabolic panel     Status: Abnormal   Collection Time: 08/15/14  2:27 AM  Result Value Ref Range   Sodium 139 135 - 145 mmol/L   Potassium 5.4 (H) 3.5 - 5.1 mmol/L   Chloride 110 96 - 112 mmol/L   CO2 23 19 - 32 mmol/L   Glucose, Bld 142 (H) 70 - 99 mg/dL   BUN 11 6 - 23 mg/dL   Creatinine, Ser 1.21 (H) 0.50 - 1.10 mg/dL   Calcium 8.4 8.4 - 10.5 mg/dL   GFR calc non Af Amer 37 (L) >90 mL/min   GFR calc Af Amer 42 (L) >90 mL/min    Comment: (NOTE) The eGFR has been calculated using the CKD EPI equation. This calculation has not been validated in all clinical situations. eGFR's persistently <90 mL/min signify possible Chronic Kidney Disease.    Anion gap 6 5 - 15  Basic metabolic panel     Status: Abnormal   Collection Time: 08/16/14  6:13 AM  Result Value Ref Range  Sodium 138 135 - 145 mmol/L   Potassium 3.9 3.5 - 5.1 mmol/L   Chloride 111 96 - 112 mmol/L   CO2 19 19 - 32 mmol/L   Glucose, Bld 135 (H) 70 - 99 mg/dL   BUN 12 6 - 23 mg/dL   Creatinine, Ser 1.03 0.50 - 1.10 mg/dL   Calcium 8.4 8.4 - 10.5 mg/dL   GFR calc non Af Amer 44 (L) >90 mL/min   GFR calc Af Amer 51 (L) >90 mL/min    Comment: (NOTE) The eGFR has been calculated using the CKD EPI equation. This calculation has not been validated in all clinical situations. eGFR's persistently <90 mL/min signify possible Chronic Kidney Disease.    Anion gap 8 5 - 15    Imaging / Studies: No results found.  Medications / Allergies:  Scheduled Meds: . antiseptic oral rinse  7 mL Mouth Rinse q12n4p  . chlorhexidine  15 mL Mouth Rinse BID  . famotidine (PEPCID) IV  20 mg Intravenous Q24H  . levothyroxine  44 mcg Intravenous Daily  . metoprolol  2.5 mg Intravenous Q12H  . sodium chloride  3 mL Intravenous Q12H   Continuous Infusions: .  dextrose 5 % and 0.9% NaCl 75 mL/hr at 08/16/14 0134   PRN Meds:.sodium chloride, acetaminophen, HYDROcodone-acetaminophen, morphine injection, ondansetron **OR** ondansetron (ZOFRAN) IV, sodium chloride  Antibiotics: Anti-infectives    Start     Dose/Rate Route Frequency Ordered Stop   08/14/14 0830  [MAR Hold]  cefoTEtan (CEFOTAN) 2 g in dextrose 5 % 50 mL IVPB     (MAR Hold since 08/14/14 0818)  Comments:  Pharmacy may adjust dose strength for optimal dosing.   Send with patient on call to the OR.  Anesthesia to complete antibiotic administration [facility]64mn [facility]   2 g 100 mL/hr over 30 Minutes Intravenous On call to O.R. 08/14/14 0756 08/14/14 0930       Assessment/Plan Large right colon mass with partial stricture POD#2 right hemicolectomy ---Dr. TGeorgette DoverPost op ileus -ice chips -await bowel function -c/w morphine and norco(pt takes tramadol for chronic back pain) -mobilize -IS  -SCD -may add chemical prophylaxis from surgical standpoint -anti-emetics, should she start vomiting will obtain a KUB and insert NGT AKI -improved -c/w gentle hydration -I&Os   EErby Pian ANP-BC [facility]ingo[facility] Pager [phone](7A-4:30P) For consults and floor pages call [phone](7A-4:30P)  08/16/2014 8:47 AM

## 2014-08-16 NOTE — Progress Notes (Signed)
PATIENT DETAILS Name: Cheryl Larson Age: 81 y.o. Sex: female Date of Birth: Mar 30, 1916 Admit Date: 08/11/2014 Admitting Physician Florencia Reasons, MD JJO:ACZYSA,YTKZSW, MD  Subjective: Feels nauseous this morning. No BM yet, thinks she passed some flatus yesterday.  Assessment/Plan: Principal Problem:   Colonic mass: Admitted and seen in consultation by general surgery.Underwent right hemicolectomy on 3/10. Gen surgery awaiting return of bowel function. Continue supportive care.   Active Problems:    History of atrial fibrillation: Rate controlled,previously on coumadin. CHADS2VASC score of 4-suspect no longer a anticoagulation given advanced age,frailty and nowj ust post surgery. Continue intravenous metoprolol. Follow.    Hypothyroidism: Continue IV levothyroxine-once oral intake resumed-Will change to oral    Anemia: Likely secondary to colonic mass/chronic illness. Follow CBC-hemoglobin remained stable    Large splenic infarct: Seen on CT scan. Not a candidate for long-term anticoagulation. Seems to be asymptomatic without any abdominal pain  Disposition: Remain inpatient-suspect may require SNF on discharge.   Antibiotics:  See below   Anti-infectives    Start     Dose/Rate Route Frequency Ordered Stop   08/14/14 0830  [MAR Hold]  cefoTEtan (CEFOTAN) 2 g in dextrose 5 % 50 mL IVPB     (MAR Hold since 08/14/14 0818)  Comments:  Pharmacy may adjust dose strength for optimal dosing.   Send with patient on call to the OR.  Anesthesia to complete antibiotic administration <5min prior to incision per [facility].   2 g 100 mL/hr over 30 Minutes Intravenous On call to O.R. 08/14/14 0756 08/14/14 0930      DVT Prophylaxis:  SCD's  Code Status: Full code  Family Communication None at bedside  Procedures:  None  CONSULTS:  cardiology and general surgery  MEDICATIONS: Scheduled Meds: . antiseptic oral rinse  7 mL Mouth Rinse q12n4p  . chlorhexidine  15 mL  Mouth Rinse BID  . famotidine (PEPCID) IV  20 mg Intravenous Q24H  . heparin subcutaneous  5,000 Units Subcutaneous 3 times per day  . levothyroxine  44 mcg Intravenous Daily  . metoprolol  2.5 mg Intravenous Q12H  . sodium chloride  3 mL Intravenous Q12H   Continuous Infusions: . dextrose 5 % and 0.9% NaCl 75 mL/hr at 08/16/14 0134   PRN Meds:.sodium chloride, acetaminophen, HYDROcodone-acetaminophen, morphine injection, ondansetron **OR** ondansetron (ZOFRAN) IV, sodium chloride    PHYSICAL EXAM: Vital signs in last 24 hours: Filed Vitals:   08/15/14 1139 08/15/14 1611 08/15/14 2117 08/16/14 0534  BP: 126/80 149/77 147/83 152/82  Pulse: 97 92 82 88  Temp: 98.9 F (37.2 C) 98.8 F (37.1 C) 97.3 F (36.3 C) 98 F (36.7 C)  TempSrc: Oral Oral Oral Oral  Resp: 19 18  18   Height:      Weight:      SpO2: 95% 100% 99% 96%    Weight change:  Filed Weights   08/12/14 0124 08/12/14 2248 08/14/14 1752  Weight: 55.974 kg (123 lb 6.4 oz) 58.605 kg (129 lb 3.2 oz) 60.1 kg (132 lb 7.9 oz)   Body mass index is 24.23 kg/(m^2).   Gen Exam: Awake and alert with clear speech. Not in any distress  Neck: Supple, No JVD.   Chest: B/L Clear.  No rales or rhonchi CVS: S1 S2 irregular, no murmurs.  Abdomen: soft, BS + very sluggish ,Appropriately tender-dressing in place-no soakage Extremities: no edema, lower extremities warm to touch. Neurologic: Non Focal.   Skin: No Rash.  Wounds: N/A.   Intake/Output from previous day:  Intake/Output Summary (Last 24 hours) at 08/16/14 1355 Last data filed at 08/16/14 0946  Gross per 24 hour  Intake 961.25 ml  Output    100 ml  Net 861.25 ml     LAB RESULTS: CBC  Recent Labs Lab 08/11/14 2130 08/15/14 0227  WBC 14.5* 11.8*  HGB 10.1* 10.0*  HCT 32.5* 32.4*  PLT 360 332  MCV 83.3 83.1  MCH 25.9* 25.6*  MCHC 31.1 30.9  RDW 19.2* 20.0*  LYMPHSABS 2.0  --   MONOABS 1.8*  --   EOSABS 0.0  --   BASOSABS 0.0  --     Chemistries    Recent Labs Lab 08/11/14 1532 08/11/14 2130 08/15/14 0227 08/16/14 0613  NA  --  133* 139 138  K  --  4.1 5.4* 3.9  CL  --  100 110 111  CO2  --  24 23 19   GLUCOSE  --  114* 142* 135*  BUN  --  23 11 12   CREATININE 1.00 0.95 1.21* 1.03  CALCIUM  --  9.6 8.4 8.4    CBG: No results for input(s): GLUCAP in the last 168 hours.  GFR Estimated Creatinine Clearance: 25.3 mL/min (by C-G formula based on Cr of 1.03).  Coagulation profile No results for input(s): INR, PROTIME in the last 168 hours.  Cardiac Enzymes No results for input(s): CKMB, TROPONINI, MYOGLOBIN in the last 168 hours.  Invalid input(s): CK  Invalid input(s): POCBNP No results for input(s): DDIMER in the last 72 hours. No results for input(s): HGBA1C in the last 72 hours. No results for input(s): CHOL, HDL, LDLCALC, TRIG, CHOLHDL, LDLDIRECT in the last 72 hours.  Recent Labs  08/14/14 0650  TSH 3.632   No results for input(s): VITAMINB12, FOLATE, FERRITIN, TIBC, IRON, RETICCTPCT in the last 72 hours. No results for input(s): LIPASE, AMYLASE in the last 72 hours.  Urine Studies No results for input(s): UHGB, CRYS in the last 72 hours.  Invalid input(s): UACOL, UAPR, USPG, UPH, UTP, UGL, UKET, UBIL, UNIT, UROB, ULEU, UEPI, UWBC, URBC, UBAC, CAST, UCOM, BILUA  MICROBIOLOGY: Recent Results (from the past 240 hour(s))  Clostridium Difficile by PCR     Status: None   Collection Time: 08/13/14  6:11 PM  Result Value Ref Range Status   C difficile by pcr NEGATIVE NEGATIVE Final  Surgical pcr screen     Status: None   Collection Time: 08/13/14  7:19 PM  Result Value Ref Range Status   MRSA, PCR NEGATIVE NEGATIVE Final   Staphylococcus aureus NEGATIVE NEGATIVE Final    Comment:        The Xpert SA Assay (FDA approved for NASAL specimens in patients over 41 years of age), is one component of a comprehensive surveillance program.  Test performance has been validated by [facility] for patients  greater than or equal to 58 year old. It is not intended to diagnose infection nor to guide or monitor treatment.     RADIOLOGY STUDIES/RESULTS: Dg Abd 1 View  08/04/2014   CLINICAL DATA:  Right lower quadrant palpable abnormality  EXAM: ABDOMEN - 1 VIEW  COMPARISON:  None.  FINDINGS: Scattered large and small bowel gas is noted. Fecal material is noted throughout the colon. Degenerative changes of lumbar spine with a scoliosis concave to the left are seen. Mild osteophytic changes are noted. No abnormal mass is identified.  IMPRESSION: No abnormality to correspond with the patient's history.  Electronically Signed   By: Inez Catalina M.D.   On: 08/04/2014 16:54   Ct Abdomen Pelvis W Contrast  08/11/2014   CLINICAL DATA:  RIGHT lower quadrant abdominal pain, feels like she has a mass in that area, 60 pound weight loss over 3 years, occult GI bleeding, hypertension, atrial fibrillation  EXAM: CT ABDOMEN AND PELVIS WITH CONTRAST  TECHNIQUE: Multidetector CT imaging of the abdomen and pelvis was performed using the standard protocol following bolus administration of intravenous contrast. Sagittal and coronal MPR images reconstructed from axial data set.  CONTRAST:  180mL OMNIPAQUE IOHEXOL 300 MG/ML SOLN IV. Dilute oral contrast.  COMPARISON:  None  FINDINGS: Small bibasilar pleural effusions and atelectasis.  Large area of absent enhancement within the spleen consistent with a large splenic infarct.  No splenic vein is identified though perisplenic collaterals are seen.  Small splenule adjacent to splenic hilum.  Atrophic pancreas with CBD dilatation and prior cholecystectomy.  Liver, kidneys, and adrenal glands normal.  Large mass at hepatic flexure of colon measuring 9.6 x 6.3 x 6.3 cm in size consistent with a colonic neoplasm.  Mild infiltrative changes of the fat surrounding the mass are seen which could be related to edema, ascites, or tumor extension.  Mass obstructs the RIGHT colon, with dilatation  of the ascending colon and cecum as well as of multiple small bowel loops.  Transverse colon decompressed.  Diverticulosis of descending and sigmoid colon without evidence of diverticulitis.  Moderate sized hiatal hernia.  Remainder of stomach unremarkable.  Atherosclerotic changes at aorta and coronary arteries.  No additional mass, adenopathy, or free air.  Minimal perihepatic and pelvic ascites.  Bones demineralized with scattered degenerative disc and facet disease changes of the thoracolumbar spine.  IMPRESSION: Large mass at hepatic flexure of colon measuring 9.6 x 6.3 x 6.3 cm in size consistent with a colonic neoplasm, causing colonic and small bowel obstruction.  Minimal peritumoral infiltration could be related to edema/ascites though tumor extension is not excluded.  Minimal ascites.  Atrophic pancreas.  Additionally, large splenic infarct with nonvisualization of the splenic vein and note of scattered collaterals in the perisplenic region.  Findings called to [employer] on 08/11/2014 at 1613 hours.  Findings also discussed with patient and her daughter.   Electronically Signed   By: Lavonia Dana M.D.   On: 08/11/2014 16:37   Dg Chest Port 1 View  08/11/2014   CLINICAL DATA:  Low abdominal pain since October 2015, recent diagnosis of abdominal tumor. Loss of appetite, intermittent diarrhea and constipation. Weakness.  EXAM: PORTABLE CHEST - 1 VIEW  COMPARISON:  None.  FINDINGS: The cardiac silhouette appears mild to moderately enlarged. Mediastinal silhouette is nonsuspicious. Large hiatal hernia. Bold bibasilar strandy densities, mildly elevated LEFT hemidiaphragm. Skin folds project in LEFT chest. No pleural effusion or focal consolidation. Gaseous distended esophagus. Soft tissue planes and included osseous structures are nonsuspicious.  IMPRESSION: Mild to moderate cardiomegaly.  Bibasilar atelectasis.  Large hiatal hernia.   Electronically Signed   By: Elon Alas   On: 08/11/2014 23:17     Oren Binet, MD  [facility] Pager:[phone]  If 7PM-7AM, please contact night-coverage [email or web address] Password [email or web address] 08/16/2014, 1:55 PM   LOS: 4 days

## 2014-08-16 NOTE — Evaluation (Signed)
Occupational Therapy Evaluation Patient Details Name: Cheryl Larson MRN: [id number] DOB: Oct 01, 1915 Today's Date: 08/16/2014    History of Present Illness Pt admit with colon mass.  Hemicolectomy performed.    Clinical Impression   Pt admitted with above. She demonstrates the below listed deficits and will benefit from continued OT to maximize safety and independence with BADLs.  Pt presents to OT with generalized weakness and acute pain.  She is able to perform BADLs with min - max A.  She was living with her daughter for ~5 wks PTA (before that was living alone).   Family unable to provide 24 hour assist at discharge.  Recommend SNF level rehab at discharge.       Follow Up Recommendations  SNF    Equipment Recommendations  None recommended by OT    Recommendations for Other Services       Precautions / Restrictions Precautions Precautions: Fall      Mobility Bed Mobility               General bed mobility comments: Pt sitting up in recliner   Transfers Overall transfer level: Needs assistance Equipment used: Rolling walker (2 wheeled) Transfers: Sit to/from [facility] Sit to Stand: Min assist Stand pivot transfers: Min assist       General transfer comment: verbal cues for hand placement and assit to move into standing, steady when standing, and to control descent     Balance Overall balance assessment: Needs assistance Sitting-balance support: Feet supported Sitting balance-Leahy Scale: Fair     Standing balance support: Bilateral upper extremity supported Standing balance-Leahy Scale: Poor                              ADL Overall ADL's : Needs assistance/impaired Eating/Feeding: NPO   Grooming: Wash/dry hands;Wash/dry face;Oral care;Brushing hair;Set up;Sitting   Upper Body Bathing: Minimal assitance;Sitting   Lower Body Bathing: Maximal assistance;Sit to/from stand   Upper Body Dressing : Minimal  assistance;Sitting   Lower Body Dressing: Total assistance;Sit to/from stand Lower Body Dressing Details (indicate cue type and reason): Pt unable to access LEs due to increased pain  Toilet Transfer: Minimal assistance;Ambulation;Comfort height toilet;BSC;RW   Toileting- Clothing Manipulation and Hygiene: Maximal assistance;Sit to/from stand       Functional mobility during ADLs: Minimal assistance;Rolling walker General ADL Comments: Pt is very guarded with movement     Vision     Perception     Praxis      Pertinent Vitals/Pain Pain Assessment: 0-10 Pain Score: 6  Pain Location: abdomen Pain Descriptors / Indicators: Aching;Constant Pain Intervention(s): Premedicated before session;Repositioned     Hand Dominance Right   Extremity/Trunk Assessment Upper Extremity Assessment Upper Extremity Assessment: Generalized weakness   Lower Extremity Assessment Lower Extremity Assessment: Defer to PT evaluation   Cervical / Trunk Assessment Cervical / Trunk Assessment: Kyphotic   Communication Communication Communication: No difficulties   Cognition Arousal/Alertness: Awake/alert Behavior During Therapy: Flat affect Overall Cognitive Status: Within Functional Limits for tasks assessed                     General Comments   Pt ambulated ~84ft with min A     Exercises       Shoulder Instructions      Home Living Family/patient expects to be discharged to:: Skilled nursing facility  Additional Comments: Daughter present for eval.  She reports that pt moved in with her ~5 weeks ago.  They are unable to provide 24 hour assist at discharge and are planning on SNF level rehab before discharge home       Prior Functioning/Environment Level of Independence: Independent             OT Diagnosis: Generalized weakness;Acute pain   OT Problem List: Decreased strength;Decreased activity tolerance;Impaired balance  (sitting and/or standing);Decreased knowledge of use of DME or AE;Pain   OT Treatment/Interventions: Self-care/ADL training;DME and/or AE instruction;Therapeutic activities;Patient/family education;Balance training    OT Goals(Current goals can be found in the care plan section) Acute Rehab OT Goals Patient Stated Goal: to feel better  OT Goal Formulation: With patient Time For Goal Achievement: 08/30/14 Potential to Achieve Goals: Good ADL Goals Pt Will Perform Grooming: with supervision;standing Pt Will Perform Upper Body Bathing: with supervision;with set-up;sitting Pt Will Perform Lower Body Bathing: with supervision;with adaptive equipment;sit to/from stand Pt Will Perform Upper Body Dressing: with supervision;sitting Pt Will Perform Lower Body Dressing: with supervision;with adaptive equipment;sit to/from stand Pt Will Transfer to Toilet: with supervision;ambulating;regular height toilet;bedside commode;grab bars Pt Will Perform Toileting - Clothing Manipulation and hygiene: with supervision;sit to/from stand  OT Frequency: Min 2X/week   Barriers to D/C: Decreased caregiver support          Co-evaluation              End of Session Equipment Utilized During Treatment: Surveyor, mining Communication: Other (comment);Mobility status (bleeding from IV site )  Activity Tolerance: Patient limited by pain;Patient limited by fatigue Patient left: in chair;with call bell/phone within reach;with family/visitor present   Time: 6295-2841 OT Time Calculation (min): 23 min Charges:  OT General Charges $OT Visit: 1 Procedure OT Evaluation $Initial OT Evaluation Tier I: 1 Procedure OT Treatments $Therapeutic Activity: 8-22 mins G-Codes:    Praise Stennett M 09-02-14, 4:51 PM

## 2014-08-16 NOTE — Progress Notes (Signed)
OT Cancellation Note  Patient Details Name: Cheryl Larson MRN: [id number] DOB: 10/17/1915   Cancelled Treatment:    Reason Eval/Treat Not Completed: Fatigue/lethargy limiting ability to participate - Pt reports she is nauseated and fatigued after getting OOB and ambulating to BR, and "can't" work with OT at this time.  Will reattempt late today as schedule allows.   Darlina Rumpf [place], OTR/L [phone]  08/16/2014, 10:17 AM

## 2014-08-16 NOTE — Plan of Care (Signed)
Problem: Phase I Progression Outcomes Goal: OOB as tolerated unless otherwise ordered Outcome: Completed/Met Date Met:  08/16/14 Sat in chair and walked in hall with PT

## 2014-08-17 ENCOUNTER — Encounter (HOSPITAL_COMMUNITY): Payer: Self-pay | Admitting: Surgery

## 2014-08-17 ENCOUNTER — Inpatient Hospital Stay (HOSPITAL_COMMUNITY): Payer: Medicare Other

## 2014-08-17 LAB — CBC
HEMATOCRIT: 34.1 % — AB (ref 36.0–46.0)
Hemoglobin: 10.3 g/dL — ABNORMAL LOW (ref 12.0–15.0)
MCH: 25.1 pg — ABNORMAL LOW (ref 26.0–34.0)
MCHC: 30.2 g/dL (ref 30.0–36.0)
MCV: 83.2 fL (ref 78.0–100.0)
Platelets: 368 10*3/uL (ref 150–400)
RBC: 4.1 MIL/uL (ref 3.87–5.11)
RDW: 20.5 % — AB (ref 11.5–15.5)
WBC: 14.6 10*3/uL — AB (ref 4.0–10.5)

## 2014-08-17 NOTE — Progress Notes (Signed)
PATIENT DETAILS Name: Cheryl Larson Age: 81 y.o. Sex: female Date of Birth: [birth year] Admit Date: 08/11/2014 Admitting Physician Florencia Reasons, MD OHY:[id number],GYIRSW, MD  Subjective: Feels nauseous this morning. Passed Flatus yesterday.No BM yet  Assessment/Plan: Principal Problem:   Colonic mass: Admitted and seen in consultation by general surgery.Underwent right hemicolectomy on 3/10. Gen surgery awaiting return of bowel function. Continue supportive care.Surgical pathology pending.   Active Problems:    History of atrial fibrillation: Rate controlled,previously on coumadin. CHADS2VASC score of 4-suspect no longer a anticoagulation given advanced age,frailty and nowj ust post surgery. Continue intravenous metoprolol. Follow.    Hypothyroidism: Continue IV levothyroxine-once oral intake resumed-Will change to oral    Anemia: Likely secondary to colonic mass/chronic illness. Follow CBC-hemoglobin remained stable    Large splenic infarct: Seen on CT scan. Not a candidate for long-term anticoagulation. Seems to be asymptomatic without any abdominal pain  Disposition: Remain inpatient-suspect may require SNF on discharge.   Antibiotics:  See below   Anti-infectives    Start     Dose/Rate Route Frequency Ordered Stop   08/14/14 0830  [MAR Hold]  cefoTEtan (CEFOTAN) 2 g in dextrose 5 % 50 mL IVPB     (MAR Hold since 08/14/14 0818)  Comments:  Pharmacy may adjust dose strength for optimal dosing.   Send with patient on call to the OR.  Anesthesia to complete antibiotic administration <66min prior to incision per [facility]   2 g 100 mL/hr over 30 Minutes Intravenous On call to O.R. 08/14/14 0756 08/14/14 0930      DVT Prophylaxis:  SCD's  Code Status: Full code  Family Communication None at bedside  Procedures:  None  CONSULTS:  cardiology and general surgery  MEDICATIONS: Scheduled Meds: . antiseptic oral rinse  7 mL Mouth Rinse q12n4p  .  chlorhexidine  15 mL Mouth Rinse BID  . famotidine (PEPCID) IV  20 mg Intravenous Q24H  . heparin subcutaneous  5,000 Units Subcutaneous 3 times per day  . levothyroxine  44 mcg Intravenous Daily  . metoprolol  2.5 mg Intravenous Q12H  . sodium chloride  3 mL Intravenous Q12H   Continuous Infusions: . dextrose 5 % and 0.9% NaCl 75 mL/hr at 08/17/14 0838   PRN Meds:.sodium chloride, acetaminophen, HYDROcodone-acetaminophen, morphine injection, ondansetron **OR** ondansetron (ZOFRAN) IV, sodium chloride    PHYSICAL EXAM: Vital signs in last 24 hours: Filed Vitals:   08/16/14 0534 08/16/14 1348 08/16/14 2243 08/17/14 0609  BP: 152/82 126/82 147/89 150/85  Pulse: 88 94 93 89  Temp: 98 F (36.7 C) 98.4 F (36.9 C) 97.7 F (36.5 C) 98.8 F (37.1 C)  TempSrc: Oral Oral Oral Oral  Resp: 18 18 16 16   Height:      Weight:      SpO2: 96% 98% 95% 96%    Weight change:  Filed Weights   08/12/14 0124 08/12/14 2248 08/14/14 1752  Weight: 55.974 kg (123 lb 6.4 oz) 58.605 kg (129 lb 3.2 oz) 60.1 kg (132 lb 7.9 oz)   Body mass index is 24.23 kg/(m^2).   Gen Exam: Awake and alert with clear speech. Not in any distress  Neck: Supple, No JVD.   Chest: B/L Clear.  No rales or rhonchi CVS: S1 S2 irregular, no murmurs.  Abdomen: soft, BS + very sluggish ,Appropriately tender-dressing in place-no soakage Extremities: no edema, lower extremities warm to touch. Neurologic: Non Focal.   Skin: No Rash.   Wounds: N/A.  Intake/Output from previous day:  Intake/Output Summary (Last 24 hours) at 08/17/14 1313 Last data filed at 08/16/14 1831  Gross per 24 hour  Intake    120 ml  Output      0 ml  Net    120 ml     LAB RESULTS: CBC  Recent Labs Lab 08/11/14 2130 08/15/14 0227 08/17/14 0508  WBC 14.5* 11.8* 14.6*  HGB 10.1* 10.0* 10.3*  HCT 32.5* 32.4* 34.1*  PLT 360 332 368  MCV 83.3 83.1 83.2  MCH 25.9* 25.6* 25.1*  MCHC 31.1 30.9 30.2  RDW 19.2* 20.0* 20.5*  LYMPHSABS 2.0   --   --   MONOABS 1.8*  --   --   EOSABS 0.0  --   --   BASOSABS 0.0  --   --     Chemistries   Recent Labs Lab 08/11/14 1532 08/11/14 2130 08/15/14 0227 08/16/14 0613  NA  --  133* 139 138  K  --  4.1 5.4* 3.9  CL  --  100 110 111  CO2  --  24 23 19   GLUCOSE  --  114* 142* 135*  BUN  --  23 11 12   CREATININE 1.00 0.95 1.21* 1.03  CALCIUM  --  9.6 8.4 8.4    CBG: No results for input(s): GLUCAP in the last 168 hours.  GFR Estimated Creatinine Clearance: 25.3 mL/min (by C-G formula based on Cr of 1.03).  Coagulation profile No results for input(s): INR, PROTIME in the last 168 hours.  Cardiac Enzymes No results for input(s): CKMB, TROPONINI, MYOGLOBIN in the last 168 hours.  Invalid input(s): CK  Invalid input(s): POCBNP No results for input(s): DDIMER in the last 72 hours. No results for input(s): HGBA1C in the last 72 hours. No results for input(s): CHOL, HDL, LDLCALC, TRIG, CHOLHDL, LDLDIRECT in the last 72 hours. No results for input(s): TSH, T4TOTAL, T3FREE, THYROIDAB in the last 72 hours.  Invalid input(s): FREET3 No results for input(s): VITAMINB12, FOLATE, FERRITIN, TIBC, IRON, RETICCTPCT in the last 72 hours. No results for input(s): LIPASE, AMYLASE in the last 72 hours.  Urine Studies No results for input(s): UHGB, CRYS in the last 72 hours.  Invalid input(s): UACOL, UAPR, USPG, UPH, UTP, UGL, UKET, UBIL, UNIT, UROB, ULEU, UEPI, UWBC, URBC, UBAC, CAST, UCOM, BILUA  MICROBIOLOGY: Recent Results (from the past 240 hour(s))  Clostridium Difficile by PCR     Status: None   Collection Time: 08/13/14  6:11 PM  Result Value Ref Range Status   C difficile by pcr NEGATIVE NEGATIVE Final  Surgical pcr screen     Status: None   Collection Time: 08/13/14  7:19 PM  Result Value Ref Range Status   MRSA, PCR NEGATIVE NEGATIVE Final   Staphylococcus aureus NEGATIVE NEGATIVE Final    Comment:        The Xpert SA Assay (FDA approved for NASAL specimens in  patients over 4 years of age), is one component of a comprehensive surveillance program.  Test performance has been validated by [facility] for patients greater than or equal to 62 year old. It is not intended to diagnose infection nor to guide or monitor treatment.     RADIOLOGY STUDIES/RESULTS: Dg Abd 1 View  08/04/2014   CLINICAL DATA:  Right lower quadrant palpable abnormality  EXAM: ABDOMEN - 1 VIEW  COMPARISON:  None.  FINDINGS: Scattered large and small bowel gas is noted. Fecal material is noted throughout the colon. Degenerative changes of  lumbar spine with a scoliosis concave to the left are seen. Mild osteophytic changes are noted. No abnormal mass is identified.  IMPRESSION: No abnormality to correspond with the patient's history.   Electronically Signed   By: Inez Catalina M.D.   On: 08/04/2014 16:54   Ct Abdomen Pelvis W Contrast  08/11/2014   CLINICAL DATA:  RIGHT lower quadrant abdominal pain, feels like she has a mass in that area, 60 pound weight loss over 3 years, occult GI bleeding, hypertension, atrial fibrillation  EXAM: CT ABDOMEN AND PELVIS WITH CONTRAST  TECHNIQUE: Multidetector CT imaging of the abdomen and pelvis was performed using the standard protocol following bolus administration of intravenous contrast. Sagittal and coronal MPR images reconstructed from axial data set.  CONTRAST:  136mL OMNIPAQUE IOHEXOL 300 MG/ML SOLN IV. Dilute oral contrast.  COMPARISON:  None  FINDINGS: Small bibasilar pleural effusions and atelectasis.  Large area of absent enhancement within the spleen consistent with a large splenic infarct.  No splenic vein is identified though perisplenic collaterals are seen.  Small splenule adjacent to splenic hilum.  Atrophic pancreas with CBD dilatation and prior cholecystectomy.  Liver, kidneys, and adrenal glands normal.  Large mass at hepatic flexure of colon measuring 9.6 x 6.3 x 6.3 cm in size consistent with a colonic neoplasm.  Mild infiltrative  changes of the fat surrounding the mass are seen which could be related to edema, ascites, or tumor extension.  Mass obstructs the RIGHT colon, with dilatation of the ascending colon and cecum as well as of multiple small bowel loops.  Transverse colon decompressed.  Diverticulosis of descending and sigmoid colon without evidence of diverticulitis.  Moderate sized hiatal hernia.  Remainder of stomach unremarkable.  Atherosclerotic changes at aorta and coronary arteries.  No additional mass, adenopathy, or free air.  Minimal perihepatic and pelvic ascites.  Bones demineralized with scattered degenerative disc and facet disease changes of the thoracolumbar spine.  IMPRESSION: Large mass at hepatic flexure of colon measuring 9.6 x 6.3 x 6.3 cm in size consistent with a colonic neoplasm, causing colonic and small bowel obstruction.  Minimal peritumoral infiltration could be related to edema/ascites though tumor extension is not excluded.  Minimal ascites.  Atrophic pancreas.  Additionally, large splenic infarct with nonvisualization of the splenic vein and note of scattered collaterals in the perisplenic region.  Findings called to [employer] on 08/11/2014 at 1613 hours.  Findings also discussed with patient and her daughter.   Electronically Signed   By: Lavonia Dana M.D.   On: 08/11/2014 16:37   Dg Chest Port 1 View  08/11/2014   CLINICAL DATA:  Low abdominal pain since October 2015, recent diagnosis of abdominal tumor. Loss of appetite, intermittent diarrhea and constipation. Weakness.  EXAM: PORTABLE CHEST - 1 VIEW  COMPARISON:  None.  FINDINGS: The cardiac silhouette appears mild to moderately enlarged. Mediastinal silhouette is nonsuspicious. Large hiatal hernia. Bold bibasilar strandy densities, mildly elevated LEFT hemidiaphragm. Skin folds project in LEFT chest. No pleural effusion or focal consolidation. Gaseous distended esophagus. Soft tissue planes and included osseous structures are nonsuspicious.   IMPRESSION: Mild to moderate cardiomegaly.  Bibasilar atelectasis.  Large hiatal hernia.   Electronically Signed   By: Elon Alas   On: 08/11/2014 23:17    Oren Binet, MD  [facility] Pager:[phone]  If 7PM-7AM, please contact night-coverage [email or web address] Password [email or web address] 08/17/2014, 1:13 PM   LOS: 5 days

## 2014-08-17 NOTE — Progress Notes (Signed)
Patient ID: Cheryl Larson, female   DOB: 05-Sep-1915, 81 y.o.   MRN: [id number]     [facility]      [address] [place], [place] [address]    Phone: [phone] FAX: [phone]     Subjective: Nausea last night.  No vomiting.  No flatus.  VSS.  Afebrile.   Objective:  Vital signs:  Filed Vitals:   08/16/14 0534 08/16/14 1348 08/16/14 2243 08/17/14 0609  BP: 152/82 126/82 147/89 150/85  Pulse: 88 94 93 89  Temp: 98 F (36.7 C) 98.4 F (36.9 C) 97.7 F (36.5 C) 98.8 F (37.1 C)  TempSrc: Oral Oral Oral Oral  Resp: _0 Height:      Weight:      SpO2: 96% 98% 95% 96%    Last BM Date: 08/13/14  Intake/Output   Yesterday:  03/12 0701 - 03/13 0700 In: 150 [P.O.:150] Out: -  This shift:    Physical Exam: General: Pt awake/alert/oriented x4 in no acute distress Abdomen: Soft. +bs. distended. Mild TTP. Midline incision-dressing removed, staples in place.  +BS. No evidence of peritonitis. No incarcerated hernias.   Problem List:   Principal Problem:   Colonic mass Active Problems:   Anemia, iron deficiency   Essential hypertension   Hypothyroidism   Chronic atrial fibrillation   Partial small bowel obstruction   Abdominal mass, RUQ (right upper quadrant)    Results:   Labs: Results for orders placed or performed during the hospital encounter of 08/11/14 (from the past 48 hour(s))  Basic metabolic panel     Status: Abnormal   Collection Time: 08/16/14  6:13 AM  Result Value Ref Range   Sodium 138 135 - 145 mmol/L   Potassium 3.9 3.5 - 5.1 mmol/L   Chloride 111 96 - 112 mmol/L   CO2 19 19 - 32 mmol/L   Glucose, Bld 135 (H) 70 - 99 mg/dL   BUN 12 6 - 23 mg/dL   Creatinine, Ser 1.03 0.50 - 1.10 mg/dL   Calcium 8.4 8.4 - 10.5 mg/dL   GFR calc non Af Amer 44 (L) >90 mL/min   GFR calc Af Amer 51 (L) >90 mL/min    Comment: (NOTE) The eGFR has been calculated using the CKD EPI equation. This  calculation has not been validated in all clinical situations. eGFR's persistently <90 mL/min signify possible Chronic Kidney Disease.    Anion gap 8 5 - 15  CBC     Status: Abnormal   Collection Time: 08/17/14  5:08 AM  Result Value Ref Range   WBC 14.6 (H) 4.0 - 10.5 K/uL   RBC 4.10 3.87 - 5.11 MIL/uL   Hemoglobin 10.3 (L) 12.0 - 15.0 g/dL   HCT 34.1 (L) 36.0 - 46.0 %   MCV 83.2 78.0 - 100.0 fL   MCH 25.1 (L) 26.0 - 34.0 pg   MCHC 30.2 30.0 - 36.0 g/dL   RDW 20.5 (H) 11.5 - 15.5 %   Platelets 368 150 - 400 K/uL    Imaging / Studies: No results found.  Medications / Allergies:  Scheduled Meds: . antiseptic oral rinse  7 mL Mouth Rinse q12n4p  . chlorhexidine  15 mL Mouth Rinse BID  . famotidine (PEPCID) IV  20 mg Intravenous Q24H  . heparin subcutaneous  5,000 Units Subcutaneous 3 times per day  . levothyroxine  44 mcg Intravenous Daily  . metoprolol  2.5 mg Intravenous Q12H  .  sodium chloride  3 mL Intravenous Q12H   Continuous Infusions: . dextrose 5 % and 0.9% NaCl 75 mL/hr at 08/17/14 0838   PRN Meds:.sodium chloride, acetaminophen, HYDROcodone-acetaminophen, morphine injection, ondansetron **OR** ondansetron (ZOFRAN) IV, sodium chloride  Antibiotics: Anti-infectives    Start     Dose/Rate Route Frequency Ordered Stop   08/14/14 0830  [MAR Hold]  cefoTEtan (CEFOTAN) 2 g in dextrose 5 % 50 mL IVPB     (MAR Hold since 08/14/14 0818)  Comments:  Pharmacy may adjust dose strength for optimal dosing.   Send with patient on call to the OR.  Anesthesia to complete antibiotic administration [facility]68mn [facility]   2 g 100 mL/hr over 30 Minutes Intravenous On call to O.R. 08/14/14 0756 08/14/14 0930         Assessment/Plan Large right colon mass with partial stricture POD#3 right hemicolectomy ---Dr. TGeorgette DoverPost op ileus -ice chips -await bowel function -c/w morphine and norco(pt takes tramadol for chronic back pain) -mobilize -IS   -SCD/heparin -anti-emetics -await pathology.  Dr. EMarin Olpfollows the patient.  No further Tx given age AKI -resolved -c/w gentle hydration -I&Os  EErby Pian ANP-BC [facility] Pager [phone](7A-4:30P) For consults and floor pages call [phone](7A-4:30P)  08/17/2014 9:52 AM

## 2014-08-18 ENCOUNTER — Inpatient Hospital Stay (HOSPITAL_COMMUNITY): Payer: Medicare Other

## 2014-08-18 DIAGNOSIS — E039 Hypothyroidism, unspecified: Secondary | ICD-10-CM

## 2014-08-18 DIAGNOSIS — D72829 Elevated white blood cell count, unspecified: Secondary | ICD-10-CM

## 2014-08-18 LAB — CBC
HEMATOCRIT: 30.2 % — AB (ref 36.0–46.0)
Hemoglobin: 9.6 g/dL — ABNORMAL LOW (ref 12.0–15.0)
MCH: 26.4 pg (ref 26.0–34.0)
MCHC: 31.8 g/dL (ref 30.0–36.0)
MCV: 83 fL (ref 78.0–100.0)
Platelets: 319 10*3/uL (ref 150–400)
RBC: 3.64 MIL/uL — ABNORMAL LOW (ref 3.87–5.11)
RDW: 20.4 % — ABNORMAL HIGH (ref 11.5–15.5)
WBC: 15.1 10*3/uL — AB (ref 4.0–10.5)

## 2014-08-18 LAB — BASIC METABOLIC PANEL
Anion gap: 7 (ref 5–15)
BUN: 11 mg/dL (ref 6–23)
CHLORIDE: 115 mmol/L — AB (ref 96–112)
CO2: 19 mmol/L (ref 19–32)
CREATININE: 0.8 mg/dL (ref 0.50–1.10)
Calcium: 7.7 mg/dL — ABNORMAL LOW (ref 8.4–10.5)
GFR calc Af Amer: 70 mL/min — ABNORMAL LOW (ref >90)
GFR calc non Af Amer: 60 mL/min — ABNORMAL LOW (ref >90)
Glucose, Bld: 140 mg/dL — ABNORMAL HIGH (ref 70–99)
Potassium: 3.5 mmol/L (ref 3.5–5.1)
Sodium: 141 mmol/L (ref 135–145)

## 2014-08-18 MED ORDER — VANCOMYCIN HCL IN DEXTROSE 750-5 MG/150ML-% IV SOLN
750.0000 mg | INTRAVENOUS | Status: DC
  Administered 2014-08-18 – 2014-08-20 (×3): 750 mg via INTRAVENOUS
  Filled 2014-08-18 (×4): qty 150

## 2014-08-18 MED ORDER — DEXTROSE 5 % IV SOLN
1.0000 g | INTRAVENOUS | Status: DC
  Administered 2014-08-18 – 2014-08-20 (×3): 1 g via INTRAVENOUS
  Filled 2014-08-18 (×4): qty 1

## 2014-08-18 NOTE — Clinical Social Work Placement (Signed)
Clinical Social Work Department CLINICAL SOCIAL WORK PLACEMENT NOTE 08/18/2014  Patient:  Cheryl Larson, Cheryl Larson  Account Number:  [id number] Admit date:  08/11/2014  Clinical Social Worker:  Keagon Glascoe, LCSWA  Date/time:  08/18/2014 03:25 PM  Clinical Social Work is seeking post-discharge placement for this patient at the following level of care:   SKILLED NURSING   (*CSW will update this form in Epic as items are completed)   08/18/2014  Patient/family provided with [facility] Department of Clinical Social Work's list of facilities offering this level of care within the geographic area requested by the patient (or if unable, by the patient's family).  08/18/2014  Patient/family informed of their freedom to choose among providers that offer the needed level of care, that participate in Medicare, Medicaid or managed care program needed by the patient, have an available bed and are willing to accept the patient.  08/18/2014  Patient/family informed of [facility]' ownership interest in [facility], as well as of the fact that they are under no obligation to receive care at this facility.  PASARR submitted to EDS on 08/18/2014 PASARR number received on 08/18/2014  FL2 transmitted to all facilities in geographic area requested by pt/family on  08/18/2014 FL2 transmitted to all facilities within larger geographic area on 08/18/2014  Patient informed that his/her managed care company has contracts with or will negotiate with  certain facilities, including the following:     Patient/family informed of bed offers received:   Patient chooses bed at  Physician recommends and patient chooses bed at    Patient to be transferred to  on   Patient to be transferred to facility by  Patient and family notified of transfer on  Name of family member notified:    The following physician request were entered in Epic: Physician Request  Please sign FL2.    Additional Comments:  Jones Broom. [place], MSW, [place] 08/18/2014 3:26 PM

## 2014-08-18 NOTE — Clinical Social Work Psychosocial (Signed)
Clinical Social Work Department BRIEF PSYCHOSOCIAL ASSESSMENT 08/18/2014  Patient:  Cheryl Larson, Cheryl Larson     Account Number:  [id number]     Person date:  08/11/2014  Clinical Social Worker:  Dian Queen  Date/Time:  08/18/2014 03:16 PM  Referred by:  Physician  Date Referred:  08/18/2014 Referred for  SNF Placement   Other Referral:   Interview type:  Patient Other interview type:   daughter    PSYCHOSOCIAL DATA Living Status:  FAMILY Admitted from facility:   Level of care:   Primary support name:  Mardene Celeste McDonell Primary support relationship to patient:  FAMILY Degree of support available:   Patient normally lives by herself, but she has been living with her daughter for the last couple of months because of her health decline.    CURRENT CONCERNS Current Concerns  Post-Acute Placement   Other Concerns:    SOCIAL WORK ASSESSMENT / PLAN Patient is a 81 year old female who who is alert and oriented x4.  Patient was in some pain but pleasant to talk to.  Patient states she is not feeling well, and feels very weak.  Patient states she feels likes she needs some therapy at a SNF for some short term rehab.  Patient states she has not been to SNF rehab before, but knows of people who have, and she states she is in agreement to go.  CSW spoke to patient's daughter who felt like patient is very weak as well and is not strong enough to return back home. Daughter expressed her concern that patient has not been feeling well and needs to go to rehab in order for patient to go home again.  Patient's daughter expressed that she would prefer patient to go to [facility] or [facility] if possible.  Patient and daughter are in agreement to going to SNF for short term rehab, once she is medically ready and discharge orders have been received.   Assessment/plan status:   Other assessment/ plan:   Information/referral to community resources:    PATIENT'S/FAMILY'S RESPONSE TO PLAN  OF CARE: Patient and daughter in agreement to going to short term rehab.    Jones Broom. [place], MSW, [place] 08/18/2014 3:23 PM

## 2014-08-18 NOTE — Progress Notes (Signed)
ANTIBIOTIC CONSULT NOTE - INITIAL  Pharmacy Consult for vancomycin and cefepime Indication: pneumonia  Allergies  Allergen Reactions  . Sulfa Antibiotics Hives    Patient Measurements: Height: 5\' 2"  (157.5 cm) Weight: 132 lb 7.9 oz (60.1 kg) IBW/kg (Calculated) : 50.1   Vital Signs: Temp: 97.8 F (36.6 C) (03/14 1328) Temp Source: Oral (03/14 1328) BP: 137/87 mmHg (03/14 1328) Pulse Rate: 90 (03/14 1328) Intake/Output from previous day:   Intake/Output from this shift: Total I/O In: 150 [P.O.:150] Out: 600 [Emesis/NG output:600]  Labs:  Recent Labs  08/16/14 0613 08/17/14 0508 08/18/14 0536  WBC  --  14.6* 15.1*  HGB  --  10.3* 9.6*  PLT  --  368 319  CREATININE 1.03  --  0.80   Estimated Creatinine Clearance: 32.5 mL/min (by C-G formula based on Cr of 0.8). No results for input(s): VANCOTROUGH, VANCOPEAK, VANCORANDOM, GENTTROUGH, GENTPEAK, GENTRANDOM, TOBRATROUGH, TOBRAPEAK, TOBRARND, AMIKACINPEAK, AMIKACINTROU, AMIKACIN in the last 72 hours.   Microbiology: Recent Results (from the past 720 hour(s))  Urine culture     Status: None   Collection Time: 08/04/14  4:39 PM  Result Value Ref Range Status   Urine Culture, Routine Final report  Final   Result 1 Comment  Final    Comment: Mixed urogenital flora 25,000-50,000 colony forming units per mL   Clostridium Difficile by PCR     Status: None   Collection Time: 08/13/14  6:11 PM  Result Value Ref Range Status   C difficile by pcr NEGATIVE NEGATIVE Final  Surgical pcr screen     Status: None   Collection Time: 08/13/14  7:19 PM  Result Value Ref Range Status   MRSA, PCR NEGATIVE NEGATIVE Final   Staphylococcus aureus NEGATIVE NEGATIVE Final    Comment:        The Xpert SA Assay (FDA approved for NASAL specimens in patients over 57 years of age), is one component of a comprehensive surveillance program.  Test performance has been validated by [facility] for patients greater than or equal to 43 year  old. It is not intended to diagnose infection nor to guide or monitor treatment.     Medical History: Past Medical History  Diagnosis Date  . Thyroid disease   . Hypertension   . A-fib   . Blood transfusion without reported diagnosis    Assessment: 81 year old female admitted to [facility] for diarrhea found to have colonic mass. New orders received to start vancomycin and cefepime for HCAP.   No fevers noted, wbc stable but elevated at 15. Renal function is normal with scr of 0.8.   3/9 cdiff negative  Goal of Therapy:  Vancomycin trough level 15-20 mcg/ml  Plan:  Cefepime 1g IV q24 hours Vancomycin 750mg  IV q24 hours. Follow up culture data and changes in renal function   Erin Hearing PharmD., BCPS Clinical Pharmacist Pager [phone] 08/18/2014 3:44 PM

## 2014-08-18 NOTE — Progress Notes (Addendum)
PATIENT DETAILS Name: Cheryl Larson Age: 81 y.o. Sex: female Date of Birth: Dec 20, 1915 Admit Date: 08/11/2014 Admitting Physician Florencia Reasons, MD TUU:[email or web address],KJZPHX, MD  Subjective: Continues to feel nauseous this morning. Passed Flatus yesterday.  Assessment/Plan: Principal Problem:   Colonic mass: Admitted and seen in consultation by general surgery.Underwent right hemicolectomy on 3/10. Gen surgery awaiting return of bowel function. Continue supportive care.Surgical pathology pending. Unfortunately seems to have developed Ileus-will continue to follow closely.   Active Problems:   Post operative ileus: supportive care. Started on clear liquids. Gen Surgery following    Leukocytosis:afebrile. Will go ahead and check UA/CXR. If WBC continues to worsen will need a CT Abd.   Addendum: CXR shows PNA-will start Vanco/Cefeprime for HCAP. Blood culture ordered.      History of atrial fibrillation: Rate controlled,previously on coumadin. CHADS2VASC score of 4-suspect no longer a anticoagulation given advanced age,frailty and nowj ust post surgery. Continue intravenous metoprolol. Follow.    Hypothyroidism: Continue IV levothyroxine-once oral intake resumed-Will change to oral when able.    Anemia: Likely secondary to colonic mass/chronic illness. Follow CBC-hemoglobin remains stable    Large splenic infarct: Seen on CT scan. Not a candidate for long-term anticoagulation. Seems to be asymptomatic without any abdominal pain  Disposition: Remain inpatient-suspect may require SNF on discharge.   Antibiotics:  See below   Anti-infectives    Start     Dose/Rate Route Frequency Ordered Stop   08/14/14 0830  [MAR Hold]  cefoTEtan (CEFOTAN) 2 g in dextrose 5 % 50 mL IVPB     (MAR Hold since 08/14/14 0818)  Comments:  Pharmacy may adjust dose strength for optimal dosing.   Send with patient on call to the OR.  Anesthesia to complete antibiotic administration <52min prior to incision  per [facility].   2 g 100 mL/hr over 30 Minutes Intravenous On call to O.R. 08/14/14 0756 08/14/14 0930      DVT Prophylaxis:  SCD's  Code Status: Full code  Family Communication Daughter over the phone on 3/13  Procedures:  None  CONSULTS:  cardiology and general surgery  MEDICATIONS: Scheduled Meds: . famotidine (PEPCID) IV  20 mg Intravenous Q24H  . heparin subcutaneous  5,000 Units Subcutaneous 3 times per day  . levothyroxine  44 mcg Intravenous Daily  . metoprolol  2.5 mg Intravenous Q12H  . sodium chloride  3 mL Intravenous Q12H   Continuous Infusions: . dextrose 5 % and 0.9% NaCl 75 mL/hr at 08/17/14 2214   PRN Meds:.sodium chloride, acetaminophen, HYDROcodone-acetaminophen, morphine injection, ondansetron **OR** ondansetron (ZOFRAN) IV, sodium chloride    PHYSICAL EXAM: Vital signs in last 24 hours: Filed Vitals:   08/17/14 0609 08/17/14 1327 08/17/14 2224 08/18/14 0643  BP: 150/85 144/83 145/81 144/90  Pulse: 89 88 84 96  Temp: 98.8 F (37.1 C) 98.2 F (36.8 C) 98.2 F (36.8 C) 97.7 F (36.5 C)  TempSrc: Oral Oral Oral Oral  Resp: 16 17 16 16   Height:      Weight:      SpO2: 96% 97% 97% 94%    Weight change:  Filed Weights   08/12/14 0124 08/12/14 2248 08/14/14 1752  Weight: 55.974 kg (123 lb 6.4 oz) 58.605 kg (129 lb 3.2 oz) 60.1 kg (132 lb 7.9 oz)   Body mass index is 24.23 kg/(m^2).   Gen Exam: Awake and alert with clear speech. In mild discomfort due to nausea Neck: Supple, No JVD.   Chest:  B/L Clear.  No rales or rhonchi CVS: S1 S2 irregular, no murmurs.  Abdomen: soft, BS + very sluggish ,Appropriately tender-dressing in place-no soakage. Mildly distended Extremities: no edema, lower extremities warm to touch. Neurologic: Non Focal.   Skin: No Rash.   Wounds: N/A.   Intake/Output from previous day:  Intake/Output Summary (Last 24 hours) at 08/18/14 1023 Last data filed at 08/18/14 1017  Gross per 24 hour  Intake    120 ml   Output      0 ml  Net    120 ml     LAB RESULTS: CBC  Recent Labs Lab 08/11/14 2130 08/15/14 0227 08/17/14 0508 08/18/14 0536  WBC 14.5* 11.8* 14.6* 15.1*  HGB 10.1* 10.0* 10.3* 9.6*  HCT 32.5* 32.4* 34.1* 30.2*  PLT 360 332 368 319  MCV 83.3 83.1 83.2 83.0  MCH 25.9* 25.6* 25.1* 26.4  MCHC 31.1 30.9 30.2 31.8  RDW 19.2* 20.0* 20.5* 20.4*  LYMPHSABS 2.0  --   --   --   MONOABS 1.8*  --   --   --   EOSABS 0.0  --   --   --   BASOSABS 0.0  --   --   --     Chemistries   Recent Labs Lab 08/11/14 1532 08/11/14 2130 08/15/14 0227 08/16/14 0613 08/18/14 0536  NA  --  133* 139 138 141  K  --  4.1 5.4* 3.9 3.5  CL  --  100 110 111 115*  CO2  --  24 23 19 19   GLUCOSE  --  114* 142* 135* 140*  BUN  --  23 11 12 11   CREATININE 1.00 0.95 1.21* 1.03 0.80  CALCIUM  --  9.6 8.4 8.4 7.7*    CBG: No results for input(s): GLUCAP in the last 168 hours.  GFR Estimated Creatinine Clearance: 32.5 mL/min (by C-G formula based on Cr of 0.8).  Coagulation profile No results for input(s): INR, PROTIME in the last 168 hours.  Cardiac Enzymes No results for input(s): CKMB, TROPONINI, MYOGLOBIN in the last 168 hours.  Invalid input(s): CK  Invalid input(s): POCBNP No results for input(s): DDIMER in the last 72 hours. No results for input(s): HGBA1C in the last 72 hours. No results for input(s): CHOL, HDL, LDLCALC, TRIG, CHOLHDL, LDLDIRECT in the last 72 hours. No results for input(s): TSH, T4TOTAL, T3FREE, THYROIDAB in the last 72 hours.  Invalid input(s): FREET3 No results for input(s): VITAMINB12, FOLATE, FERRITIN, TIBC, IRON, RETICCTPCT in the last 72 hours. No results for input(s): LIPASE, AMYLASE in the last 72 hours.  Urine Studies No results for input(s): UHGB, CRYS in the last 72 hours.  Invalid input(s): UACOL, UAPR, USPG, UPH, UTP, UGL, UKET, UBIL, UNIT, UROB, ULEU, UEPI, UWBC, URBC, UBAC, CAST, UCOM, BILUA  MICROBIOLOGY: Recent Results (from the past 240  hour(s))  Clostridium Difficile by PCR     Status: None   Collection Time: 08/13/14  6:11 PM  Result Value Ref Range Status   C difficile by pcr NEGATIVE NEGATIVE Final  Surgical pcr screen     Status: None   Collection Time: 08/13/14  7:19 PM  Result Value Ref Range Status   MRSA, PCR NEGATIVE NEGATIVE Final   Staphylococcus aureus NEGATIVE NEGATIVE Final    Comment:        The Xpert SA Assay (FDA approved for NASAL specimens in patients over 68 years of age), is one component of a comprehensive surveillance program.  Test performance  has been validated by [facility] for patients greater than or equal to 80 year old. It is not intended to diagnose infection nor to guide or monitor treatment.     RADIOLOGY STUDIES/RESULTS: Dg Abd 1 View  08/04/2014   CLINICAL DATA:  Right lower quadrant palpable abnormality  EXAM: ABDOMEN - 1 VIEW  COMPARISON:  None.  FINDINGS: Scattered large and small bowel gas is noted. Fecal material is noted throughout the colon. Degenerative changes of lumbar spine with a scoliosis concave to the left are seen. Mild osteophytic changes are noted. No abnormal mass is identified.  IMPRESSION: No abnormality to correspond with the patient's history.   Electronically Signed   By: Inez Catalina M.D.   On: 08/04/2014 16:54   Ct Abdomen Pelvis W Contrast  08/11/2014   CLINICAL DATA:  RIGHT lower quadrant abdominal pain, feels like she has a mass in that area, 60 pound weight loss over 3 years, occult GI bleeding, hypertension, atrial fibrillation  EXAM: CT ABDOMEN AND PELVIS WITH CONTRAST  TECHNIQUE: Multidetector CT imaging of the abdomen and pelvis was performed using the standard protocol following bolus administration of intravenous contrast. Sagittal and coronal MPR images reconstructed from axial data set.  CONTRAST:  1100mL OMNIPAQUE IOHEXOL 300 MG/ML SOLN IV. Dilute oral contrast.  COMPARISON:  None  FINDINGS: Small bibasilar pleural effusions and atelectasis.   Large area of absent enhancement within the spleen consistent with a large splenic infarct.  No splenic vein is identified though perisplenic collaterals are seen.  Small splenule adjacent to splenic hilum.  Atrophic pancreas with CBD dilatation and prior cholecystectomy.  Liver, kidneys, and adrenal glands normal.  Large mass at hepatic flexure of colon measuring 9.6 x 6.3 x 6.3 cm in size consistent with a colonic neoplasm.  Mild infiltrative changes of the fat surrounding the mass are seen which could be related to edema, ascites, or tumor extension.  Mass obstructs the RIGHT colon, with dilatation of the ascending colon and cecum as well as of multiple small bowel loops.  Transverse colon decompressed.  Diverticulosis of descending and sigmoid colon without evidence of diverticulitis.  Moderate sized hiatal hernia.  Remainder of stomach unremarkable.  Atherosclerotic changes at aorta and coronary arteries.  No additional mass, adenopathy, or free air.  Minimal perihepatic and pelvic ascites.  Bones demineralized with scattered degenerative disc and facet disease changes of the thoracolumbar spine.  IMPRESSION: Large mass at hepatic flexure of colon measuring 9.6 x 6.3 x 6.3 cm in size consistent with a colonic neoplasm, causing colonic and small bowel obstruction.  Minimal peritumoral infiltration could be related to edema/ascites though tumor extension is not excluded.  Minimal ascites.  Atrophic pancreas.  Additionally, large splenic infarct with nonvisualization of the splenic vein and note of scattered collaterals in the perisplenic region.  Findings called to [employer] on 08/11/2014 at 1613 hours.  Findings also discussed with patient and her daughter.   Electronically Signed   By: Lavonia Dana M.D.   On: 08/11/2014 16:37   Dg Chest Port 1 View  08/11/2014   CLINICAL DATA:  Low abdominal pain since October 2015, recent diagnosis of abdominal tumor. Loss of appetite, intermittent diarrhea and  constipation. Weakness.  EXAM: PORTABLE CHEST - 1 VIEW  COMPARISON:  None.  FINDINGS: The cardiac silhouette appears mild to moderately enlarged. Mediastinal silhouette is nonsuspicious. Large hiatal hernia. Bold bibasilar strandy densities, mildly elevated LEFT hemidiaphragm. Skin folds project in LEFT chest. No pleural effusion or focal consolidation.  Gaseous distended esophagus. Soft tissue planes and included osseous structures are nonsuspicious.  IMPRESSION: Mild to moderate cardiomegaly.  Bibasilar atelectasis.  Large hiatal hernia.   Electronically Signed   By: Elon Alas   On: 08/11/2014 23:17   Dg Abd Portable 1v  08/17/2014   CLINICAL DATA:  Vomiting, colon cancer and hepatic flexure post resection, postoperative day 3  EXAM: PORTABLE ABDOMEN - 1 VIEW  COMPARISON:  Portable exam 1302 hr compared to 08/04/2014 and correlated with CT abdomen and pelvis of 08/11/2014  FINDINGS: Ventral surgical clips from laparotomy.  Small amount retained contrast in distal colon and within colonic diverticula.  Air-filled loops of small bowel likely reflecting postoperative ileus.  No evidence of obstruction or wall thickening.  Bibasilar effusions and atelectasis.  Osseous demineralization with degenerative changes of the lumbar spine with dextro convex scoliosis.  IMPRESSION: Suspected mild postoperative ileus.   Electronically Signed   By: Lavonia Dana M.D.   On: 08/17/2014 13:26    Oren Binet, MD  [facility] Pager:[phone]  If 7PM-7AM, please contact night-coverage [email or web address] Password [email or web address] 08/18/2014, 10:23 AM   LOS: 6 days

## 2014-08-18 NOTE — Progress Notes (Addendum)
PT Cancellation Note  Patient Details Name: Cheryl Larson MRN: [id number] DOB: 09-Apr-1916   Cancelled Treatment:    Reason Eval/Treat Not Completed: Medical issues which prohibited therapy.  Attempted to see patient x2 today.  Nauseated in am.  In pm, patient reports she has been up to bathroom and does not feel like she can get up again.  Will return tomorrow for PT session.  Also, noted in chart patient does not have 24 hour assist.  Patient will need SNF at discharge.   Despina Pole 08/18/2014, 3:46 PM Carita Pian. Sanjuana Kava, [place] Pager [phone]

## 2014-08-18 NOTE — Progress Notes (Signed)
Patient ID: Cheryl Larson, female   DOB: 06/04/1916, 81 y.o.   MRN: [id number]     [facility]      [address] [place], [place] [address]    Phone: [phone] FAX: [phone]     Subjective: No n/v.  No flatus yet.  Tolerating ice chips and water.  VSS.  Afebrile.  WBC continues to increase.   Objective:  Vital signs:  Filed Vitals:   08/17/14 0609 08/17/14 1327 08/17/14 2224 08/18/14 0643  BP: 150/85 144/83 145/81 144/90  Pulse: 89 88 84 96  Temp: 98.8 F (37.1 C) 98.2 F (36.8 C) 98.2 F (36.8 C) 97.7 F (36.5 C)  TempSrc: Oral Oral Oral Oral  Resp: 16 17 16 16   Height:      Weight:      SpO2: 96% 97% 97% 94%    Last BM Date: 08/13/14  Intake/Output   Yesterday:    This shift:     Physical Exam: General: Pt awake/alert/oriented x4 in no acute distress Abdomen: Soft. +bs. distended. Mild TTP. Midline incision-dressing removed, staples in place.  +BS. No evidence of peritonitis. No incarcerated hernias.    Problem List:   Principal Problem:   Colonic mass Active Problems:   Anemia, iron deficiency   Essential hypertension   Hypothyroidism   Chronic atrial fibrillation   Partial small bowel obstruction   Abdominal mass, RUQ (right upper quadrant)    Results:   Labs: Results for orders placed or performed during the hospital encounter of 08/11/14 (from the past 48 hour(s))  CBC     Status: Abnormal   Collection Time: 08/17/14  5:08 AM  Result Value Ref Range   WBC 14.6 (H) 4.0 - 10.5 K/uL   RBC 4.10 3.87 - 5.11 MIL/uL   Hemoglobin 10.3 (L) 12.0 - 15.0 g/dL   HCT 34.1 (L) 36.0 - 46.0 %   MCV 83.2 78.0 - 100.0 fL   MCH 25.1 (L) 26.0 - 34.0 pg   MCHC 30.2 30.0 - 36.0 g/dL   RDW 20.5 (H) 11.5 - 15.5 %   Platelets 368 150 - 400 K/uL  CBC     Status: Abnormal   Collection Time: 08/18/14  5:36 AM  Result Value Ref Range   WBC 15.1 (H) 4.0 - 10.5 K/uL   RBC 3.64 (L) 3.87 - 5.11 MIL/uL   Hemoglobin 9.6 (L) 12.0 - 15.0 g/dL   HCT 30.2 (L) 36.0 - 46.0 %   MCV 83.0 78.0 - 100.0 fL   MCH 26.4 26.0 - 34.0 pg   MCHC 31.8 30.0 - 36.0 g/dL   RDW 20.4 (H) 11.5 - 15.5 %   Platelets 319 150 - 400 K/uL  Basic metabolic panel     Status: Abnormal   Collection Time: 08/18/14  5:36 AM  Result Value Ref Range   Sodium 141 135 - 145 mmol/L   Potassium 3.5 3.5 - 5.1 mmol/L   Chloride 115 (H) 96 - 112 mmol/L   CO2 19 19 - 32 mmol/L   Glucose, Bld 140 (H) 70 - 99 mg/dL   BUN 11 6 - 23 mg/dL   Creatinine, Ser 0.80 0.50 - 1.10 mg/dL   Calcium 7.7 (L) 8.4 - 10.5 mg/dL   GFR calc non Af Amer 60 (L) >90 mL/min   GFR calc Af Amer 70 (L) >90 mL/min    Comment: (NOTE) The eGFR has been calculated using the CKD EPI equation. This calculation  has not been validated in all clinical situations. eGFR's persistently <90 mL/min signify possible Chronic Kidney Disease.    Anion gap 7 5 - 15    Imaging / Studies: Dg Abd Portable 1v  08/17/2014   CLINICAL DATA:  Vomiting, colon cancer and hepatic flexure post resection, postoperative day 3  EXAM: PORTABLE ABDOMEN - 1 VIEW  COMPARISON:  Portable exam 1302 hr compared to 08/04/2014 and correlated with CT abdomen and pelvis of 08/11/2014  FINDINGS: Ventral surgical clips from laparotomy.  Small amount retained contrast in distal colon and within colonic diverticula.  Air-filled loops of small bowel likely reflecting postoperative ileus.  No evidence of obstruction or wall thickening.  Bibasilar effusions and atelectasis.  Osseous demineralization with degenerative changes of the lumbar spine with dextro convex scoliosis.  IMPRESSION: Suspected mild postoperative ileus.   Electronically Signed   By: Lavonia Dana M.D.   On: 08/17/2014 13:26    Medications / Allergies:  Scheduled Meds: . antiseptic oral rinse  7 mL Mouth Rinse q12n4p  . chlorhexidine  15 mL Mouth Rinse BID  . famotidine (PEPCID) IV  20 mg Intravenous Q24H  . heparin subcutaneous  5,000  Units Subcutaneous 3 times per day  . levothyroxine  44 mcg Intravenous Daily  . metoprolol  2.5 mg Intravenous Q12H  . sodium chloride  3 mL Intravenous Q12H   Continuous Infusions: . dextrose 5 % and 0.9% NaCl 75 mL/hr at 08/17/14 2214   PRN Meds:.sodium chloride, acetaminophen, HYDROcodone-acetaminophen, morphine injection, ondansetron **OR** ondansetron (ZOFRAN) IV, sodium chloride  Antibiotics: Anti-infectives    Start     Dose/Rate Route Frequency Ordered Stop   08/14/14 0830  [MAR Hold]  cefoTEtan (CEFOTAN) 2 g in dextrose 5 % 50 mL IVPB     (MAR Hold since 08/14/14 0818)  Comments:  Pharmacy may adjust dose strength for optimal dosing.   Send with patient on call to the OR.  Anesthesia to complete antibiotic administration <38mn [facility]   2 g 100 mL/hr over 30 Minutes Intravenous On call to O.R. 08/14/14 0756 08/14/14 0930         Assessment/Plan Large right colon mass with partial stricture POD#4 right hemicolectomy ---Dr. TGeorgette DoverPost op ileus -clears today, do not advance until bowel function return -await bowel function -c/w morphine and norco(pt takes tramadol for chronic back pain) -mobilize -IS  -SCD/heparin -anti-emetics -await pathology. Dr. EMarin Olpfollows the patient. No further Tx regardless of path given age AKI -resolved -c/w gentle hydration -I&Os Leukocytosis(trending up) -UA 3/7 negative.  CXR 3/7 okay. No dysuria, intermittent non productive cough and no sob.  Does have a splenic infarct on previous CT of abdomen, but not impressively tender on exam.  Could have intra-abdominal abscess, but only 4 days post op and afebrile.     EErby Pian [facility] Pager [phone] For consults and floor pages call [phone]([id number])  08/18/2014 7:52 AM

## 2014-08-19 LAB — CBC
HCT: 29.9 % — ABNORMAL LOW (ref 36.0–46.0)
Hemoglobin: 9 g/dL — ABNORMAL LOW (ref 12.0–15.0)
MCH: 25.2 pg — AB (ref 26.0–34.0)
MCHC: 30.1 g/dL (ref 30.0–36.0)
MCV: 83.8 fL (ref 78.0–100.0)
PLATELETS: 288 10*3/uL (ref 150–400)
RBC: 3.57 MIL/uL — ABNORMAL LOW (ref 3.87–5.11)
RDW: 20.5 % — AB (ref 11.5–15.5)
WBC: 13.4 10*3/uL — AB (ref 4.0–10.5)

## 2014-08-19 MED ORDER — BOOST / RESOURCE BREEZE PO LIQD
1.0000 | Freq: Three times a day (TID) | ORAL | Status: DC
  Administered 2014-08-19 – 2014-08-25 (×15): 1 via ORAL

## 2014-08-19 NOTE — Progress Notes (Signed)
INITIAL NUTRITION ASSESSMENT  DOCUMENTATION CODES Per approved criteria  -Not Applicable   INTERVENTION: Resource Breeze po TID, each supplement provides 250 kcal and 9 grams of protein  NUTRITION DIAGNOSIS: Inadequate oral intake related to poor appetite as evidenced by PO: 0-5%.   Goal: Pt will meet >90% of estimated nutritional needs  Monitor:  PO/supplement intake, labs, weight changes, I/O's  Reason for Assessment: Consult for poor po intake  81 y.o. female  Admitting Dx: Colonic mass  81 year old female with a history of atrial fibrillation (not on anticoagulation), hypothyroidism admitted for evaluation of diarrhea. CT abdomen showed a large mass at the hepatic flexure. General surgery was consulted, cardiology was also consulted for preoperative clearance. 2-D echocardiogram revealed normal ejection fraction. Patient underwent right hemicolectomy on 08/14/14. Unfortunately postoperative course as been complicated by ileus that has now started to resolve, and also by development of hospital-acquired pneumonia. Plans are to discharge to SNF when medically ready.  ASSESSMENT: Pt s/p rt hemicolectomy on 08/14/14 due to rt colonic mass with partial stricture. Pt developed a post-op ileus; general surgery noted bowel sounds this AM.   Pt reports decreased appetite, but improving. Breakfast tray at bedside reveals pt consumed all of her coffee and juice and 50% of her chicken broth. Meal intake reveals 0-5% meal completion. She refused lunch yesterday. She reports decreased appetite PTA due to abdominal pain. She typically follows a regular diet at home and eats well, reporting his daughter is a very good cook. She reveals a hx of weight loss over the past several years, prior to moving in with her daughter. Wt loss is not significant over the past year, in fact, noted wt gain trend. Suspect muscle depletion is due to advanced age. She is concerned about weight loss and is interested in  trying nutritional supplements to maximize calorie and protein intake. She has tried [employer] in the past and prefers this over [employer] and [employer]- RD will order.  Discussed importance of good PO intake to promote healing process.  Discharge disposition is to SNF once medically stable.  Labs reviewed. Cl: 115, Calcium: 7.7, Glucose: 140.   Nutrition Focused Physical Exam:  Subcutaneous Fat:  Orbital Region: mild depletion Upper Arm Region: mild depletion Thoracic and Lumbar Region: WDL  Muscle:  Temple Region: mild depletion Clavicle Bone Region: WDL Clavicle and Acromion Bone Region: WDL Scapular Bone Region: WDL Dorsal Hand: mild depletion Patellar Region: WDL Anterior Thigh Region: WDL Posterior Calf Region: WDL  Edema: none present  Height: Ht Readings from Last 1 Encounters:  08/14/14 5\' 2"  (1.575 m)    Weight: Wt Readings from Last 1 Encounters:  08/14/14 132 lb 7.9 oz (60.1 kg)    Ideal Body Weight: 110#  % Ideal Body Weight: 120%  Wt Readings from Last 10 Encounters:  08/14/14 132 lb 7.9 oz (60.1 kg)  08/04/14 116 lb (52.617 kg)  07/24/14 121 lb (54.885 kg)  07/18/14 122 lb (55.339 kg)  06/20/14 121 lb 6.4 oz (55.067 kg)  06/04/12 142 lb (64.411 kg)  03/05/12 145 lb (65.772 kg)  12/09/11 145 lb (65.772 kg)  11/04/11 147 lb (66.679 kg)    Usual Body Weight: 145#  % Usual Body Weight: 91%  BMI:  Body mass index is 24.23 kg/(m^2). Normal weight range  Estimated Nutritional Needs: Kcal: 1600-1800 Protein: 75-85 grams Fluid: 1.6-1.8 L  Skin: closed abdominal incision  Diet Order: Diet full liquid  EDUCATION NEEDS: -Education needs addressed   Intake/Output Summary (Last 24 hours)  at 08/19/14 1152 Last data filed at 08/19/14 1100  Gross per 24 hour  Intake   1717 ml  Output    810 ml  Net    907 ml    Last BM: 08/13/14  Labs:   Recent Labs Lab 08/15/14 0227 08/16/14 0613 08/18/14 0536  NA 139 138 141  K 5.4* 3.9 3.5  CL 110  111 115*  CO2 23 19 19   BUN 11 12 11   CREATININE 1.21* 1.03 0.80  CALCIUM 8.4 8.4 7.7*  GLUCOSE 142* 135* 140*    CBG (last 3)  No results for input(s): GLUCAP in the last 72 hours.  Scheduled Meds: . ceFEPime (MAXIPIME) IV  1 g Intravenous Q24H  . famotidine (PEPCID) IV  20 mg Intravenous Q24H  . heparin subcutaneous  5,000 Units Subcutaneous 3 times per day  . levothyroxine  44 mcg Intravenous Daily  . metoprolol  2.5 mg Intravenous Q12H  . sodium chloride  3 mL Intravenous Q12H  . vancomycin  750 mg Intravenous Q24H    Continuous Infusions: . dextrose 5 % and 0.9% NaCl 20 mL/hr at 08/19/14 1110    Past Medical History  Diagnosis Date  . Thyroid disease   . Hypertension   . A-fib   . Blood transfusion without reported diagnosis     Past Surgical History  Procedure Laterality Date  . Cholecystectomy    . Colostomy revision Right 08/14/2014    Procedure: RIGHT HEMI COLECTOMY;  Surgeon: Donnie Mesa, MD;  Location: [place];  Service: General;  Laterality: Right;    Brittanni Cariker A. Jimmye Norman, RD, LDN, CDE Pager: [phone] After hours Pager: [phone]

## 2014-08-19 NOTE — Clinical Social Work Note (Signed)
CSW attempted to call patient's daughter Mardene Celeste to give her an update on bed offers.  Left message for her to call CSW back in the morning.  Jones Broom. [place], MSW, [place] 08/19/2014 4:20 PM

## 2014-08-19 NOTE — Progress Notes (Signed)
Physical Therapy Treatment Patient Details Name: Cheryl Larson MRN: [id number] DOB: 28-May-1916 Today's Date: 08/19/2014    History of Present Illness Pt admit with colon mass.  Hemicolectomy performed.     PT Comments    Patient progressing this session with ambulation but does fatigue quickly. Patient does not have 24/7 supervision at home therefore patient and daughter have agreed to SNF placement for ongoing therapy. Updated chart appropriately. Continue with current POC.   Follow Up Recommendations  SNF     Equipment Recommendations  3in1 (PT)    Recommendations for Other Services       Precautions / Restrictions Precautions Precautions: Fall    Mobility  Bed Mobility Overal bed mobility: Needs Assistance         Sit to supine: Min assist   General bed mobility comments: Min A for trunk support up into sitting  Transfers Overall transfer level: Needs assistance Equipment used: Rolling walker (2 wheeled)   Sit to Stand: Min assist         General transfer comment: verbal cues for hand placement and assit to move into standing, steady when standing, and to control descent   Ambulation/Gait Ambulation/Gait assistance: Min assist Ambulation Distance (Feet): 50 Feet (x 2)   Gait Pattern/deviations: Step-through pattern;Decreased stride length;Trunk flexed   Gait velocity interpretation: Below normal speed for age/gender General Gait Details: Pt with flexed posture.  Pt takes small steps and needed cues for walker sequencing and step technique.  Fatigues quickly and required seated rest break.    Stairs            Wheelchair Mobility    Modified Rankin (Stroke Patients Only)       Balance   Sitting-balance support: Feet supported;No upper extremity supported Sitting balance-Leahy Scale: Good       Standing balance-Leahy Scale: Poor Standing balance comment: Requires UE support for balance                    Cognition  Arousal/Alertness: Awake/alert Behavior During Therapy: Flat affect Overall Cognitive Status: Within Functional Limits for tasks assessed                      Exercises      General Comments        Pertinent Vitals/Pain Pain Assessment: No/denies pain    Home Living                      Prior Function            PT Goals (current goals can now be found in the care plan section) Progress towards PT goals: Progressing toward goals    Frequency  Min 3X/week    PT Plan Discharge plan needs to be updated    Co-evaluation             End of Session   Activity Tolerance: Patient tolerated treatment well Patient left: in chair;with call bell/phone within reach     Time: 0840-0904 PT Time Calculation (min) (ACUTE ONLY): 24 min  Charges:  $Gait Training: 8-22 mins $Therapeutic Activity: 8-22 mins                    G Codes:      Jacqualyn Posey 08/19/2014, 9:10 AM 08/19/2014 Jacqualyn Posey PTA [phone] pager [phone] office

## 2014-08-19 NOTE — Progress Notes (Signed)
Patient ID: Cheryl Larson, female   DOB: [birth year], 81 y.o.   MRN: [id number]     [facility]      [address] [place], [place] [address]    Phone: [phone] FAX: [phone]     Subjective: Had loose stools.  Not much of an appetite.   Objective:  Vital signs:  Filed Vitals:   08/18/14 0643 08/18/14 1328 08/18/14 2120 08/19/14 0520  BP: 144/90 137/87 132/78 115/58  Pulse: 96 90 91 91  Temp: 97.7 F (36.5 C) 97.8 F (36.6 C) 98.1 F (36.7 C) 98.1 F (36.7 C)  TempSrc: Oral Oral Oral Oral  Resp: 16 16 19 19   Height:      Weight:      SpO2: 94% 95% 94% 94%    Last BM Date: 08/13/14  Intake/Output   Yesterday:  03/14 0701 - 03/15 0700 In: 8016 [P.O.:150; I.V.:1387] Out: 810 [Emesis/NG output:600; Stool:210] This shift: I/O last 3 completed shifts: In: 5537 [P.O.:150; I.V.:1387] Out: 810 [Emesis/NG output:600; Stool:210]     Physical Exam: General: Pt awake/alert/oriented x4 in no acute distress Abdomen: Soft. +bs. Mildly distended. Mild TTP. Midline incision-dressing removed, staples in place.  +BS. No evidence of peritonitis. No incarcerated hernias.    Problem List:   Principal Problem:   Colonic mass Active Problems:   Anemia, iron deficiency   Essential hypertension   Hypothyroidism   Chronic atrial fibrillation   Partial small bowel obstruction   Abdominal mass, RUQ (right upper quadrant)    Results:   Labs: Results for orders placed or performed during the hospital encounter of 08/11/14 (from the past 48 hour(s))  CBC     Status: Abnormal   Collection Time: 08/18/14  5:36 AM  Result Value Ref Range   WBC 15.1 (H) 4.0 - 10.5 K/uL   RBC 3.64 (L) 3.87 - 5.11 MIL/uL   Hemoglobin 9.6 (L) 12.0 - 15.0 g/dL   HCT 30.2 (L) 36.0 - 46.0 %   MCV 83.0 78.0 - 100.0 fL   MCH 26.4 26.0 - 34.0 pg   MCHC 31.8 30.0 - 36.0 g/dL   RDW 20.4 (H) 11.5 - 15.5 %   Platelets 319 150 - 400 K/uL  Basic  metabolic panel     Status: Abnormal   Collection Time: 08/18/14  5:36 AM  Result Value Ref Range   Sodium 141 135 - 145 mmol/L   Potassium 3.5 3.5 - 5.1 mmol/L   Chloride 115 (H) 96 - 112 mmol/L   CO2 19 19 - 32 mmol/L   Glucose, Bld 140 (H) 70 - 99 mg/dL   BUN 11 6 - 23 mg/dL   Creatinine, Ser 0.80 0.50 - 1.10 mg/dL   Calcium 7.7 (L) 8.4 - 10.5 mg/dL   GFR calc non Af Amer 60 (L) >90 mL/min   GFR calc Af Amer 70 (L) >90 mL/min    Comment: (NOTE) The eGFR has been calculated using the CKD EPI equation. This calculation has not been validated in all clinical situations. eGFR's persistently <90 mL/min signify possible Chronic Kidney Disease.    Anion gap 7 5 - 15  CBC     Status: Abnormal   Collection Time: 08/19/14  4:40 AM  Result Value Ref Range   WBC 13.4 (H) 4.0 - 10.5 K/uL   RBC 3.57 (L) 3.87 - 5.11 MIL/uL   Hemoglobin 9.0 (L) 12.0 - 15.0 g/dL   HCT 29.9 (L) 36.0 - 46.0 %  MCV 83.8 78.0 - 100.0 fL   MCH 25.2 (L) 26.0 - 34.0 pg   MCHC 30.1 30.0 - 36.0 g/dL   RDW 20.5 (H) 11.5 - 15.5 %   Platelets 288 150 - 400 K/uL    Imaging / Studies: Dg Chest Port 1 View  08/18/2014   CLINICAL DATA:  Leukocytosis.  EXAM: PORTABLE CHEST - 1 VIEW  COMPARISON:  08/11/2014  FINDINGS: Bilateral lower lobe airspace opacities are noted, right greater than left. Suspect layering effusions. Heart is borderline in size. No acute bony abnormality.  IMPRESSION: Bilateral lower lobe airspace opacities and effusions, right greater than left. This could represent asymmetric edema or infection. Given the patient's history of leukocytosis, pneumonia is favored.   Electronically Signed   By: Rolm Baptise M.D.   On: 08/18/2014 11:14   Dg Abd Portable 1v  08/17/2014   CLINICAL DATA:  Vomiting, colon cancer and hepatic flexure post resection, postoperative day 3  EXAM: PORTABLE ABDOMEN - 1 VIEW  COMPARISON:  Portable exam 1302 hr compared to 08/04/2014 and correlated with CT abdomen and pelvis of 08/11/2014   FINDINGS: Ventral surgical clips from laparotomy.  Small amount retained contrast in distal colon and within colonic diverticula.  Air-filled loops of small bowel likely reflecting postoperative ileus.  No evidence of obstruction or wall thickening.  Bibasilar effusions and atelectasis.  Osseous demineralization with degenerative changes of the lumbar spine with dextro convex scoliosis.  IMPRESSION: Suspected mild postoperative ileus.   Electronically Signed   By: Lavonia Dana M.D.   On: 08/17/2014 13:26    Medications / Allergies:  Scheduled Meds: . ceFEPime (MAXIPIME) IV  1 g Intravenous Q24H  . famotidine (PEPCID) IV  20 mg Intravenous Q24H  . heparin subcutaneous  5,000 Units Subcutaneous 3 times per day  . levothyroxine  44 mcg Intravenous Daily  . metoprolol  2.5 mg Intravenous Q12H  . sodium chloride  3 mL Intravenous Q12H  . vancomycin  750 mg Intravenous Q24H   Continuous Infusions: . dextrose 5 % and 0.9% NaCl 75 mL/hr at 08/19/14 0235   PRN Meds:.sodium chloride, acetaminophen, HYDROcodone-acetaminophen, morphine injection, ondansetron **OR** ondansetron (ZOFRAN) IV, sodium chloride  Antibiotics: Anti-infectives    Start     Dose/Rate Route Frequency Ordered Stop   08/18/14 1800  vancomycin (VANCOCIN) IVPB 750 mg/150 ml premix     750 mg 150 mL/hr over 60 Minutes Intravenous Every 24 hours 08/18/14 1547     08/18/14 1700  ceFEPIme (MAXIPIME) 1 g in dextrose 5 % 50 mL IVPB     1 g 100 mL/hr over 30 Minutes Intravenous Every 24 hours 08/18/14 1547     08/14/14 0830  [MAR Hold]  cefoTEtan (CEFOTAN) 2 g in dextrose 5 % 50 mL IVPB     (MAR Hold since 08/14/14 0818)  Comments:  Pharmacy may adjust dose strength for optimal dosing.   Send with patient on call to the OR.  Anesthesia to complete antibiotic administration <74mn [facility]   2 g 100 mL/hr over 30 Minutes Intravenous On call to O.R. 08/14/14 0756 08/14/14 0930        Assessment/Plan Locally invasive adeno Large right colon mass with partial stricture POD#5 right hemicolectomy ---Dr. TGeorgette DoverPost op ileus -full liquid diet, advance as tolerated nutrition consult  -transition to PO pain meds -mobilize -IS  -SCD/heparin -anti-emetics -Dr. TGeorgette Doverdiscussed results with the patient.  Dr. EMarin Olpfollows.  No further treatment given age Leukocytosis -on  cefepime/vanc for PNA per IM, WBC down. -pulmonary toilet  Erby Pian, ANP-BC [facility] Pager [phone](7A-4:30P) For consults and floor pages call [phone](7A-4:30P)  08/19/2014 8:17 AM

## 2014-08-19 NOTE — Progress Notes (Signed)
Patient had brown loose stools x 2 (on 2340, medium amount and 0100, small amount), incontinent of bowel.

## 2014-08-19 NOTE — Progress Notes (Signed)
PATIENT DETAILS Name: Cheryl Larson Age: 81 y.o. Sex: female Date of Birth: 04-23-1916 Admit Date: 08/11/2014 Admitting Physician Florencia Reasons, MD AYT:KZSWFU,XNATFT, MD  Brief summary 81 year old female with a history of atrial fibrillation (not on anticoagulation), hypothyroidism admitted for evaluation of diarrhea. CT abdomen showed a large mass at the hepatic flexure. General surgery was consulted, cardiology was also consulted for preoperative clearance. 2-D echocardiogram revealed normal ejection fraction. Patient underwent right hemicolectomy on 08/14/14. Unfortunately postoperative course as been complicated by ileus that has now started to resolve, and also by development of hospital-acquired pneumonia. Plans are to discharge to SNF when medically ready.  Subjective: Claims to be much better compared to yesterday-finally had some bowel movements overnight. Tolerating clear liquids.  Assessment/Plan: Principal Problem:   Colonic mass: Admitted and seen in consultation by general surgery.Underwent right hemicolectomy on 3/10. Slowly improving, postoperative ileus is getting better. Diet being advanced.. Continue supportive care.Surgical pathology confirms locally invasive adenocarcinoma.    Active Problems:   Post operative ileus: Resolving with supportive care. Diet slowly advanced to full liquids. Gen Surgery following    Healthcare associated pneumonia: Started on empiric vancomycin/cefepime on 3/14. Clinically improved, leukocytosis has decreased. Await blood cultures. Continue current antibiotics, suspect we could transition to levofloxacin in the next day or so.     History of atrial fibrillation: Rate controlled with metoprolol,previously on coumadin. CHADS2VASC score of 4-suspect no longer a anticoagulation given advanced age,frailty and nowj ust post surgery. Continue intravenous metoprolol. Follow.    Hypothyroidism: Continue IV levothyroxine-once oral intake  resumed-Will change to oral when able.    Anemia: Likely secondary to colonic mass/chronic illness. Follow CBC-hemoglobin remains stable    Large splenic infarct: Seen on CT scan. Not a candidate for long-term anticoagulation. Seems to be asymptomatic without any abdominal pain  Disposition: Remain inpatient- SNF on discharge-suspect requires another few more days in the hospital as diet being slowly advanced   Antibiotics:  See below   Anti-infectives    Start     Dose/Rate Route Frequency Ordered Stop   08/18/14 1800  vancomycin (VANCOCIN) IVPB 750 mg/150 ml premix     750 mg 150 mL/hr over 60 Minutes Intravenous Every 24 hours 08/18/14 1547     08/18/14 1700  ceFEPIme (MAXIPIME) 1 g in dextrose 5 % 50 mL IVPB     1 g 100 mL/hr over 30 Minutes Intravenous Every 24 hours 08/18/14 1547     08/14/14 0830  [MAR Hold]  cefoTEtan (CEFOTAN) 2 g in dextrose 5 % 50 mL IVPB     (MAR Hold since 08/14/14 0818)  Comments:  Pharmacy may adjust dose strength for optimal dosing.   Send with patient on call to the OR.  Anesthesia to complete antibiotic administration <44min prior to incision per [facility].   2 g 100 mL/hr over 30 Minutes Intravenous On call to O.R. 08/14/14 0756 08/14/14 0930      DVT Prophylaxis:  SCD's  Code Status: Full code  Family Communication Daughter over the phone on 3/13  Procedures:  None  CONSULTS:  cardiology and general surgery  Oncology  MEDICATIONS: Scheduled Meds: . ceFEPime (MAXIPIME) IV  1 g Intravenous Q24H  . famotidine (PEPCID) IV  20 mg Intravenous Q24H  . heparin subcutaneous  5,000 Units Subcutaneous 3 times per day  . levothyroxine  44 mcg Intravenous Daily  . metoprolol  2.5 mg Intravenous Q12H  . sodium chloride  3 mL Intravenous  Q12H  . vancomycin  750 mg Intravenous Q24H   Continuous Infusions: . dextrose 5 % and 0.9% NaCl 75 mL/hr at 08/19/14 0235   PRN Meds:.sodium chloride, acetaminophen, HYDROcodone-acetaminophen,  morphine injection, ondansetron **OR** ondansetron (ZOFRAN) IV, sodium chloride    PHYSICAL EXAM: Vital signs in last 24 hours: Filed Vitals:   08/18/14 1328 08/18/14 2120 08/19/14 0520 08/19/14 0900  BP: 137/87 132/78 115/58 113/66  Pulse: 90 91 91 67  Temp: 97.8 F (36.6 C) 98.1 F (36.7 C) 98.1 F (36.7 C)   TempSrc: Oral Oral Oral   Resp: 16 19 19    Height:      Weight:      SpO2: 95% 94% 94%     Weight change:  Filed Weights   08/12/14 0124 08/12/14 2248 08/14/14 1752  Weight: 55.974 kg (123 lb 6.4 oz) 58.605 kg (129 lb 3.2 oz) 60.1 kg (132 lb 7.9 oz)   Body mass index is 24.23 kg/(m^2).   Gen Exam: Awake and alert with clear speech.  Neck: Supple, No JVD.   Chest: B/L Clear.  No rales or rhonchi CVS: S1 S2 irregular, no murmurs.  Abdomen: soft, BS + very sluggish ,Appropriately tender-dressing in place-no soakage. Mildly distended Extremities: no edema, lower extremities warm to touch. Neurologic: Non Focal.   Skin: No Rash.   Wounds: N/A.   Intake/Output from previous day:  Intake/Output Summary (Last 24 hours) at 08/19/14 1037 Last data filed at 08/19/14 0700  Gross per 24 hour  Intake   1417 ml  Output    810 ml  Net    607 ml     LAB RESULTS: CBC  Recent Labs Lab 08/15/14 0227 08/17/14 0508 08/18/14 0536 08/19/14 0440  WBC 11.8* 14.6* 15.1* 13.4*  HGB 10.0* 10.3* 9.6* 9.0*  HCT 32.4* 34.1* 30.2* 29.9*  PLT 332 368 319 288  MCV 83.1 83.2 83.0 83.8  MCH 25.6* 25.1* 26.4 25.2*  MCHC 30.9 30.2 31.8 30.1  RDW 20.0* 20.5* 20.4* 20.5*    Chemistries   Recent Labs Lab 08/15/14 0227 08/16/14 0613 08/18/14 0536  NA 139 138 141  K 5.4* 3.9 3.5  CL 110 111 115*  CO2 23 19 19   GLUCOSE 142* 135* 140*  BUN 11 12 11   CREATININE 1.21* 1.03 0.80  CALCIUM 8.4 8.4 7.7*    CBG: No results for input(s): GLUCAP in the last 168 hours.  GFR Estimated Creatinine Clearance: 32.5 mL/min (by C-G formula based on Cr of 0.8).  Coagulation  profile No results for input(s): INR, PROTIME in the last 168 hours.  Cardiac Enzymes No results for input(s): CKMB, TROPONINI, MYOGLOBIN in the last 168 hours.  Invalid input(s): CK  Invalid input(s): POCBNP No results for input(s): DDIMER in the last 72 hours. No results for input(s): HGBA1C in the last 72 hours. No results for input(s): CHOL, HDL, LDLCALC, TRIG, CHOLHDL, LDLDIRECT in the last 72 hours. No results for input(s): TSH, T4TOTAL, T3FREE, THYROIDAB in the last 72 hours.  Invalid input(s): FREET3 No results for input(s): VITAMINB12, FOLATE, FERRITIN, TIBC, IRON, RETICCTPCT in the last 72 hours. No results for input(s): LIPASE, AMYLASE in the last 72 hours.  Urine Studies No results for input(s): UHGB, CRYS in the last 72 hours.  Invalid input(s): UACOL, UAPR, USPG, UPH, UTP, UGL, UKET, UBIL, UNIT, UROB, ULEU, UEPI, UWBC, URBC, UBAC, CAST, UCOM, BILUA  MICROBIOLOGY: Recent Results (from the past 240 hour(s))  Clostridium Difficile by PCR     Status: None  Collection Time: 08/13/14  6:11 PM  Result Value Ref Range Status   C difficile by pcr NEGATIVE NEGATIVE Final  Surgical pcr screen     Status: None   Collection Time: 08/13/14  7:19 PM  Result Value Ref Range Status   MRSA, PCR NEGATIVE NEGATIVE Final   Staphylococcus aureus NEGATIVE NEGATIVE Final    Comment:        The Xpert SA Assay (FDA approved for NASAL specimens in patients over 22 years of age), is one component of a comprehensive surveillance program.  Test performance has been validated by [facility] for patients greater than or equal to 81 year old. It is not intended to diagnose infection nor to guide or monitor treatment.     RADIOLOGY STUDIES/RESULTS: Dg Abd 1 View  08/04/2014   CLINICAL DATA:  Right lower quadrant palpable abnormality  EXAM: ABDOMEN - 1 VIEW  COMPARISON:  None.  FINDINGS: Scattered large and small bowel gas is noted. Fecal material is noted throughout the colon.  Degenerative changes of lumbar spine with a scoliosis concave to the left are seen. Mild osteophytic changes are noted. No abnormal mass is identified.  IMPRESSION: No abnormality to correspond with the patient's history.   Electronically Signed   By: Inez Catalina M.D.   On: 08/04/2014 16:54   Ct Abdomen Pelvis W Contrast  08/11/2014   CLINICAL DATA:  RIGHT lower quadrant abdominal pain, feels like she has a mass in that area, 60 pound weight loss over 3 years, occult GI bleeding, hypertension, atrial fibrillation  EXAM: CT ABDOMEN AND PELVIS WITH CONTRAST  TECHNIQUE: Multidetector CT imaging of the abdomen and pelvis was performed using the standard protocol following bolus administration of intravenous contrast. Sagittal and coronal MPR images reconstructed from axial data set.  CONTRAST:  174mL OMNIPAQUE IOHEXOL 300 MG/ML SOLN IV. Dilute oral contrast.  COMPARISON:  None  FINDINGS: Small bibasilar pleural effusions and atelectasis.  Large area of absent enhancement within the spleen consistent with a large splenic infarct.  No splenic vein is identified though perisplenic collaterals are seen.  Small splenule adjacent to splenic hilum.  Atrophic pancreas with CBD dilatation and prior cholecystectomy.  Liver, kidneys, and adrenal glands normal.  Large mass at hepatic flexure of colon measuring 9.6 x 6.3 x 6.3 cm in size consistent with a colonic neoplasm.  Mild infiltrative changes of the fat surrounding the mass are seen which could be related to edema, ascites, or tumor extension.  Mass obstructs the RIGHT colon, with dilatation of the ascending colon and cecum as well as of multiple small bowel loops.  Transverse colon decompressed.  Diverticulosis of descending and sigmoid colon without evidence of diverticulitis.  Moderate sized hiatal hernia.  Remainder of stomach unremarkable.  Atherosclerotic changes at aorta and coronary arteries.  No additional mass, adenopathy, or free air.  Minimal perihepatic and  pelvic ascites.  Bones demineralized with scattered degenerative disc and facet disease changes of the thoracolumbar spine.  IMPRESSION: Large mass at hepatic flexure of colon measuring 9.6 x 6.3 x 6.3 cm in size consistent with a colonic neoplasm, causing colonic and small bowel obstruction.  Minimal peritumoral infiltration could be related to edema/ascites though tumor extension is not excluded.  Minimal ascites.  Atrophic pancreas.  Additionally, large splenic infarct with nonvisualization of the splenic vein and note of scattered collaterals in the perisplenic region.  Findings called to [employer] on 08/11/2014 at 1613 hours.  Findings also discussed with patient and her daughter.  Electronically Signed   By: Lavonia Dana M.D.   On: 08/11/2014 16:37   Dg Chest Port 1 View  08/18/2014   CLINICAL DATA:  Leukocytosis.  EXAM: PORTABLE CHEST - 1 VIEW  COMPARISON:  08/11/2014  FINDINGS: Bilateral lower lobe airspace opacities are noted, right greater than left. Suspect layering effusions. Heart is borderline in size. No acute bony abnormality.  IMPRESSION: Bilateral lower lobe airspace opacities and effusions, right greater than left. This could represent asymmetric edema or infection. Given the patient's history of leukocytosis, pneumonia is favored.   Electronically Signed   By: Rolm Baptise M.D.   On: 08/18/2014 11:14   Dg Chest Port 1 View  08/11/2014   CLINICAL DATA:  Low abdominal pain since October 2015, recent diagnosis of abdominal tumor. Loss of appetite, intermittent diarrhea and constipation. Weakness.  EXAM: PORTABLE CHEST - 1 VIEW  COMPARISON:  None.  FINDINGS: The cardiac silhouette appears mild to moderately enlarged. Mediastinal silhouette is nonsuspicious. Large hiatal hernia. Bold bibasilar strandy densities, mildly elevated LEFT hemidiaphragm. Skin folds project in LEFT chest. No pleural effusion or focal consolidation. Gaseous distended esophagus. Soft tissue planes and included osseous  structures are nonsuspicious.  IMPRESSION: Mild to moderate cardiomegaly.  Bibasilar atelectasis.  Large hiatal hernia.   Electronically Signed   By: Elon Alas   On: 08/11/2014 23:17   Dg Abd Portable 1v  08/17/2014   CLINICAL DATA:  Vomiting, colon cancer and hepatic flexure post resection, postoperative day 3  EXAM: PORTABLE ABDOMEN - 1 VIEW  COMPARISON:  Portable exam 1302 hr compared to 08/04/2014 and correlated with CT abdomen and pelvis of 08/11/2014  FINDINGS: Ventral surgical clips from laparotomy.  Small amount retained contrast in distal colon and within colonic diverticula.  Air-filled loops of small bowel likely reflecting postoperative ileus.  No evidence of obstruction or wall thickening.  Bibasilar effusions and atelectasis.  Osseous demineralization with degenerative changes of the lumbar spine with dextro convex scoliosis.  IMPRESSION: Suspected mild postoperative ileus.   Electronically Signed   By: Lavonia Dana M.D.   On: 08/17/2014 13:26    Oren Binet, MD  [facility] Pager:[phone]  If 7PM-7AM, please contact night-coverage [email or web address] Password [facility] 08/19/2014, 10:37 AM   LOS: 7 days

## 2014-08-20 ENCOUNTER — Inpatient Hospital Stay (HOSPITAL_COMMUNITY): Payer: Medicare Other

## 2014-08-20 LAB — BASIC METABOLIC PANEL
ANION GAP: 6 (ref 5–15)
BUN: 7 mg/dL (ref 6–23)
CALCIUM: 8.1 mg/dL — AB (ref 8.4–10.5)
CO2: 20 mmol/L (ref 19–32)
Chloride: 112 mmol/L (ref 96–112)
Creatinine, Ser: 0.77 mg/dL (ref 0.50–1.10)
GFR calc non Af Amer: 69 mL/min — ABNORMAL LOW (ref >90)
GFR, EST AFRICAN AMERICAN: 80 mL/min — AB (ref >90)
Glucose, Bld: 105 mg/dL — ABNORMAL HIGH (ref 70–99)
Potassium: 3 mmol/L — ABNORMAL LOW (ref 3.5–5.1)
Sodium: 138 mmol/L (ref 135–145)

## 2014-08-20 LAB — URINALYSIS, ROUTINE W REFLEX MICROSCOPIC
Bilirubin Urine: NEGATIVE
Glucose, UA: NEGATIVE mg/dL
Hgb urine dipstick: NEGATIVE
KETONES UR: NEGATIVE mg/dL
Leukocytes, UA: NEGATIVE
NITRITE: NEGATIVE
PROTEIN: 30 mg/dL — AB
Specific Gravity, Urine: 1.018 (ref 1.005–1.030)
Urobilinogen, UA: 0.2 mg/dL (ref 0.0–1.0)
pH: 5.5 (ref 5.0–8.0)

## 2014-08-20 LAB — URINE MICROSCOPIC-ADD ON

## 2014-08-20 LAB — CBC
HEMATOCRIT: 27.2 % — AB (ref 36.0–46.0)
Hemoglobin: 8.6 g/dL — ABNORMAL LOW (ref 12.0–15.0)
MCH: 26.1 pg (ref 26.0–34.0)
MCHC: 31.6 g/dL (ref 30.0–36.0)
MCV: 82.4 fL (ref 78.0–100.0)
Platelets: 255 10*3/uL (ref 150–400)
RBC: 3.3 MIL/uL — ABNORMAL LOW (ref 3.87–5.11)
RDW: 20.7 % — AB (ref 11.5–15.5)
WBC: 16.1 10*3/uL — ABNORMAL HIGH (ref 4.0–10.5)

## 2014-08-20 LAB — CREATININE, URINE, RANDOM: Creatinine, Urine: 85.87 mg/dL

## 2014-08-20 MED ORDER — FUROSEMIDE 10 MG/ML IJ SOLN
20.0000 mg | Freq: Once | INTRAMUSCULAR | Status: AC
  Administered 2014-08-20: 20 mg via INTRAVENOUS
  Filled 2014-08-20: qty 2

## 2014-08-20 MED ORDER — POTASSIUM CHLORIDE CRYS ER 20 MEQ PO TBCR
40.0000 meq | EXTENDED_RELEASE_TABLET | Freq: Once | ORAL | Status: AC
  Administered 2014-08-20: 40 meq via ORAL
  Filled 2014-08-20: qty 2

## 2014-08-20 NOTE — Progress Notes (Signed)
Patient ID: Cheryl Larson, female   DOB: [birth year], 81 y.o.   MRN: [id number]     [facility]      [address] [place], [place] [address]    Phone: [phone] FAX: [phone]     Subjective: Still have loose stools.  Tolerating fulls.  No n/v.     Objective:  Vital signs:  Filed Vitals:   08/19/14 0520 08/19/14 0900 08/19/14 2143 08/20/14 0556  BP: 115/58 113/66 145/72 132/57  Pulse: 91 67 52 75  Temp: 98.1 F (36.7 C)  97.4 F (36.3 C) 98 F (36.7 C)  TempSrc: Oral  Axillary Oral  Resp: 19  18 18   Height:      Weight:      SpO2: 94%  82% 98%    Last BM Date: 08/19/14  Intake/Output   Yesterday:  03/15 0701 - 03/16 0700 In: 300 [I.V.:300] Out: -  This shift:    I/O last 3 completed shifts: In: 1687 [I.V.:1687] Out: 210 [Stool:210]     Physical Exam: General: Pt awake/alert/oriented x4 in no acute distress Abdomen: Soft. +bs. Mildly distended. Mild TTP. Midline incision-dressing removed, staples in place.  +BS. No evidence of peritonitis. No incarcerated hernias.    Problem List:   Principal Problem:   Colonic mass Active Problems:   Anemia, iron deficiency   Essential hypertension   Hypothyroidism   Chronic atrial fibrillation   Partial small bowel obstruction   Abdominal mass, RUQ (right upper quadrant)    Results:   Labs: Results for orders placed or performed during the hospital encounter of 08/11/14 (from the past 48 hour(s))  CBC     Status: Abnormal   Collection Time: 08/19/14  4:40 AM  Result Value Ref Range   WBC 13.4 (H) 4.0 - 10.5 K/uL   RBC 3.57 (L) 3.87 - 5.11 MIL/uL   Hemoglobin 9.0 (L) 12.0 - 15.0 g/dL   HCT 29.9 (L) 36.0 - 46.0 %   MCV 83.8 78.0 - 100.0 fL   MCH 25.2 (L) 26.0 - 34.0 pg   MCHC 30.1 30.0 - 36.0 g/dL   RDW 20.5 (H) 11.5 - 15.5 %   Platelets 288 150 - 400 K/uL  CBC     Status: Abnormal   Collection Time: 08/20/14  6:14 AM  Result Value Ref Range   WBC 16.1 (H) 4.0 - 10.5 K/uL   RBC 3.30 (L) 3.87 - 5.11 MIL/uL   Hemoglobin 8.6 (L) 12.0 - 15.0 g/dL   HCT 27.2 (L) 36.0 - 46.0 %   MCV 82.4 78.0 - 100.0 fL   MCH 26.1 26.0 - 34.0 pg   MCHC 31.6 30.0 - 36.0 g/dL   RDW 20.7 (H) 11.5 - 15.5 %   Platelets 255 150 - 400 K/uL  Basic metabolic panel     Status: Abnormal   Collection Time: 08/20/14  6:14 AM  Result Value Ref Range   Sodium 138 135 - 145 mmol/L   Potassium 3.0 (L) 3.5 - 5.1 mmol/L   Chloride 112 96 - 112 mmol/L   CO2 20 19 - 32 mmol/L   Glucose, Bld 105 (H) 70 - 99 mg/dL   BUN 7 6 - 23 mg/dL   Creatinine, Ser 0.77 0.50 - 1.10 mg/dL   Calcium 8.1 (L) 8.4 - 10.5 mg/dL   GFR calc non Af Amer 69 (L) >90 mL/min   GFR calc Af Amer 80 (L) >90 mL/min    Comment: (  NOTE) The eGFR has been calculated using the CKD EPI equation. This calculation has not been validated in all clinical situations. eGFR's persistently <90 mL/min signify possible Chronic Kidney Disease.    Anion gap 6 5 - 15    Imaging / Studies: Dg Chest Port 1 View  08/18/2014   CLINICAL DATA:  Leukocytosis.  EXAM: PORTABLE CHEST - 1 VIEW  COMPARISON:  08/11/2014  FINDINGS: Bilateral lower lobe airspace opacities are noted, right greater than left. Suspect layering effusions. Heart is borderline in size. No acute bony abnormality.  IMPRESSION: Bilateral lower lobe airspace opacities and effusions, right greater than left. This could represent asymmetric edema or infection. Given the patient's history of leukocytosis, pneumonia is favored.   Electronically Signed   By: Rolm Baptise M.D.   On: 08/18/2014 11:14    Medications / Allergies:  Scheduled Meds: . ceFEPime (MAXIPIME) IV  1 g Intravenous Q24H  . famotidine (PEPCID) IV  20 mg Intravenous Q24H  . feeding supplement (RESOURCE BREEZE)  1 Container Oral TID BM  . heparin subcutaneous  5,000 Units Subcutaneous 3 times per day  . levothyroxine  44 mcg Intravenous Daily  . metoprolol  2.5 mg Intravenous Q12H  .  sodium chloride  3 mL Intravenous Q12H  . vancomycin  750 mg Intravenous Q24H   Continuous Infusions: . dextrose 5 % and 0.9% NaCl 20 mL/hr at 08/19/14 1110   PRN Meds:.sodium chloride, acetaminophen, HYDROcodone-acetaminophen, morphine injection, ondansetron **OR** ondansetron (ZOFRAN) IV, sodium chloride  Antibiotics: Anti-infectives    Start     Dose/Rate Route Frequency Ordered Stop   08/18/14 1800  vancomycin (VANCOCIN) IVPB 750 mg/150 ml premix     750 mg 150 mL/hr over 60 Minutes Intravenous Every 24 hours 08/18/14 1547     08/18/14 1700  ceFEPIme (MAXIPIME) 1 g in dextrose 5 % 50 mL IVPB     1 g 100 mL/hr over 30 Minutes Intravenous Every 24 hours 08/18/14 1547     08/14/14 0830  [MAR Hold]  cefoTEtan (CEFOTAN) 2 g in dextrose 5 % 50 mL IVPB     (MAR Hold since 08/14/14 0818)  Comments:  Pharmacy may adjust dose strength for optimal dosing.   Send with patient on call to the OR.  Anesthesia to complete antibiotic administration <23mn [facility]   2 g 100 mL/hr over 30 Minutes Intravenous On call to O.R. 08/14/14 0756 08/14/14 0930      Assessment/Plan Locally invasive adeno Large right colon mass with partial stricture POD#6 right hemicolectomy ---Dr. TGeorgette DoverPost op ileus -advance to soft diet.  If she continues to have loose stools will order c diff PCR -doing well with PO norco -mobilize -IS  -SCD/heparin -anti-emetics -DC staples on POD#10 -Dr. TGeorgette Doverdiscussed results with the patient. Dr. EMarin Olpfollows. No further treatment given age -further management per primary team  EErby Pian ACo[facility] HospitalSurgery Pager [phone](7A-4:30P) For consults and floor pages call [phone](7A-4:30P)  08/20/2014 8:54 AM

## 2014-08-20 NOTE — Clinical Social Work Note (Signed)
CSW spoke to patient and daughter about bed offers.  Patient and family would like to go to [facility] once she is medically ready and discharge orders have been received.  CSW contacted [facility] who said pending availability they can take her, and requested a call back once a discharge date has been confirmed.  CSW will continue to follow as patient progresses towards discharge planning.  Jones Broom. Boyd, MSW, [place] 08/20/2014 10:59 AM

## 2014-08-20 NOTE — Progress Notes (Signed)
Patient ID: Cheryl Larson, female   DOB: Jul 17, 1915, 81 y.o.   MRN: [id number]  [facility] PROGRESS NOTE  NIKOLINA SIMERSON CVE:[id number] DOB: [birth year] DOA: 08/11/2014 PCP: Claretta Fraise, MD   Brief narrative:    81 year old female with a history of atrial fibrillation (not on anticoagulation), hypothyroidism admitted for evaluation of diarrhea. CT abdomen showed a large mass at the hepatic flexure. General surgery was consulted, cardiology was also consulted for preoperative clearance. 2-D echocardiogram revealed normal ejection fraction. Patient underwent right hemicolectomy on 08/14/14. Unfortunately postoperative course as been complicated by ileus that has now started to resolve, and also by development of hospital-acquired pneumonia. Plans are to discharge to SNF when medically ready.  Assessment/Plan:    Principal Problem:   Colonic mass - status post rught hemicolectomy, post op day #6, pt is clinically stable this AM but volume overloaded - she is 16 lbs over her baseline weight since admission - will stop IVF, place on low dose of Lasix IV and monitor clinical response  Active Problems:   LE swelling - from volume overload - weight is 132 lbs this AM which is up from 116 lbs on admission - again, will stop IVF and place on Lasix as noted above    Post op ileus - slowly resolving with supportive care - advance diet to soft  and stop IVF due to volume overload    Hypokalemia - supplement and repeat BMP in AM   Acute hypoxic respiratory failure - secondary to HCAP but this AM more crackles on exam with signs of volume overload as noted above - CXR consistent with vascular congestion  - continue broad spectrum ABX and plan on narrowing down in next 24 - 48 hours if pt doing better  - also place on Lasix 20 mg IV    Essential hypertension - reasonable inpatient control  - continue Metoprolol IV for now and change to PO in next 24 hours if pt tolerating PO well    Hypothyroidism - continue synthroid    Anemia of chronic disease, colon ca - overall Hg 8 -9 - no sings of active bleeding - CBC in AM History of atrial fibrillation - Rate controlled with metoprolol - previously on coumadin. CHADS2VASC score of 4 - no longer on AC given frail condition, risk of fall  Large splenic infarct - seen on CT scan. Not a candidate for long-term anticoagulation - asymptomatic without any abdominal pain   Severe PCM - in the context of acute on chronic illness and progressive failure to thrive - full liquids allowed for now   DVT prophylaxis: Heparin SQ  Code Status: Full.  Family Communication:  plan of care discussed with daughter over the phone  Disposition Plan: SNF when volume overload resolved   IV access:  Peripheral IV  Procedures and diagnostic studies:    CXR 08/20/2014 Findings felt to represent a degree of underlying congestive heart failure. Atelectasis in each lung base. No frank airspace consolidation. Paraesophageal type hernia, better seen on recent CT.    Dg Abd 1 View   08/04/2014  No abnormality to correspond with the patient's history.     Ct Abdomen Pelvis W Contrast  08/11/2014    Large mass at hepatic flexure of colon measuring 9.6 x 6.3 x 6.3 cm in size consistent with a colonic neoplasm, causing colonic and small bowel obstruction.  Minimal peritumoral infiltration could be related to edema/ascites though tumor extension is not excluded.  Minimal ascites.  Atrophic pancreas.  Additionally, large splenic infarct with nonvisualization of the splenic vein and note of scattered collaterals in the perisplenic region.   CXR  08/18/2014  Bilateral lower lobe airspace opacities and effusions, right greater than left. This could represent asymmetric edema or infection. Given the patient's history of leukocytosis, pneumonia is favored.     Dg Abd Portable 1v  08/17/2014   Suspected mild postoperative ileus.     Medical Consultants:  Surgery    Other Consultants:  PT  IAnti-Infectives:   Vancomycin 3/14 --> Cefepime 3/14 -->  Faye Ramsay, MD  [facility] Pager [phone]  If 7PM-7AM, please contact night-coverage [email or web address] Password [facility] 08/20/2014, 5:43 PM   LOS: 8 days   HPI/Subjective: No events overnight.   Objective: Filed Vitals:   08/19/14 0520 08/19/14 0900 08/19/14 2143 08/20/14 0556  BP: 115/58 113/66 145/72 132/57  Pulse: 91 67 52 75  Temp: 98.1 F (36.7 C)  97.4 F (36.3 C) 98 F (36.7 C)  TempSrc: Oral  Axillary Oral  Resp: 19  18 18   Height:      Weight:      SpO2: 94%  82% 98%    Intake/Output Summary (Last 24 hours) at 08/20/14 1743 Last data filed at 08/20/14 1348  Gross per 24 hour  Intake    720 ml  Output      0 ml  Net    720 ml    Exam:   General:  Pt is alert, follows commands appropriately, not in acute distress  Cardiovascular: Regular rate and rhythm, no rubs, no gallops  Respiratory: Clear to auscultation bilaterally, no wheezing, bibasilar crackles   Abdomen: Soft, tender in lower abd qouadrants, non distended, no guarding  Extremities: +2 bilateral LE pitting edema, pulses DP and PT palpable bilaterally  Neuro: Grossly nonfocal  Data Reviewed: Basic Metabolic Panel:  Recent Labs Lab 08/15/14 0227 08/16/14 0613 08/18/14 0536 08/20/14 0614  NA 139 138 141 138  K 5.4* 3.9 3.5 3.0*  CL 110 111 115* 112  CO2 23 19 19 20   GLUCOSE 142* 135* 140* 105*  BUN 11 12 11 7   CREATININE 1.21* 1.03 0.80 0.77  CALCIUM 8.4 8.4 7.7* 8.1*   CBC:  Recent Labs Lab 08/15/14 0227 08/17/14 0508 08/18/14 0536 08/19/14 0440 08/20/14 0614  WBC 11.8* 14.6* 15.1* 13.4* 16.1*  HGB 10.0* 10.3* 9.6* 9.0* 8.6*  HCT 32.4* 34.1* 30.2* 29.9* 27.2*  MCV 83.1 83.2 83.0 83.8 82.4  PLT 332 368 319 288 255     Recent Results (from the past 240 hour(s))  Clostridium Difficile by PCR     Status: None   Collection Time: 08/13/14  6:11 PM  Result Value Ref Range Status   C  difficile by pcr NEGATIVE NEGATIVE Final  Surgical pcr screen     Status: None   Collection Time: 08/13/14  7:19 PM  Result Value Ref Range Status   MRSA, PCR NEGATIVE NEGATIVE Final   Staphylococcus aureus NEGATIVE NEGATIVE Final    Comment:        The Xpert SA Assay (FDA approved for NASAL specimens in patients over 34 years of age), is one component of a comprehensive surveillance program.  Test performance has been validated by [facility] for patients greater than or equal to 77 year old. It is not intended to diagnose infection nor to guide or monitor treatment.   Culture, blood (routine x 2)     Status: None (Preliminary result)   Collection Time: 08/18/14  6:12  PM  Result Value Ref Range Status   Specimen Description BLOOD LEFT ARM  Final   Special Requests BOTTLES DRAWN AEROBIC AND ANAEROBIC 5CC  Final   Culture   Final           BLOOD CULTURE RECEIVED NO GROWTH TO DATE CULTURE WILL BE HELD FOR 5 DAYS BEFORE ISSUING A FINAL NEGATIVE REPORT Performed at [facility]    Report Status PENDING  Incomplete  Culture, blood (routine x 2)     Status: None (Preliminary result)   Collection Time: 08/18/14  6:20 PM  Result Value Ref Range Status   Specimen Description BLOOD RIGHT ARM  Final   Special Requests BOTTLES DRAWN AEROBIC AND ANAEROBIC 5CC  Final   Culture   Final           BLOOD CULTURE RECEIVED NO GROWTH TO DATE CULTURE WILL BE HELD FOR 5 DAYS BEFORE ISSUING A FINAL NEGATIVE REPORT Performed at [facility]    Report Status PENDING  Incomplete     Scheduled Meds: . ceFEPime (MAXIPIME) IV  1 g Intravenous Q24H  . famotidine (PEPCID) IV  20 mg Intravenous Q24H  . feeding supplement (RESOURCE BREEZE)  1 Container Oral TID BM  . heparin subcutaneous  5,000 Units Subcutaneous 3 times per day  . levothyroxine  44 mcg Intravenous Daily  . metoprolol  2.5 mg Intravenous Q12H  . sodium chloride  3 mL Intravenous Q12H  . vancomycin  750 mg Intravenous  Q24H   Continuous Infusions:

## 2014-08-20 NOTE — Progress Notes (Signed)
Occupational Therapy Treatment Patient Details Name: Cheryl Larson MRN: [id number] DOB: January 05, 1916 Today's Date: 08/20/2014    History of present illness Pt admit with colon mass.  Hemicolectomy performed.    OT comments  Pt requires increased encouragement this date, and fatigued more quickly.  Reinforced need to ambulate at least 4x/day, and to begin performing BADLs as much as she is able, with assist.   Follow Up Recommendations  SNF    Equipment Recommendations  None recommended by OT    Recommendations for Other Services      Precautions / Restrictions Precautions Precautions: Fall       Mobility Bed Mobility                  Transfers Overall transfer level: Needs assistance Equipment used: Rolling walker (2 wheeled) Transfers: Sit to/from [facility] Sit to Stand: Min guard Stand pivot transfers: Min guard            Balance Overall balance assessment: Needs assistance Sitting-balance support: Feet supported Sitting balance-Leahy Scale: Good     Standing balance support: Bilateral upper extremity supported Standing balance-Leahy Scale: Poor                     ADL                           Toilet Transfer: Min guard;Ambulation;Comfort height toilet;RW   Toileting- Clothing Manipulation and Hygiene: Maximal assistance;Sit to/from stand       Functional mobility during ADLs: Min guard;Rolling walker General ADL Comments: Pt requires max encouragement to participate.  Reinforced need to increase activity and to ambulate at least 4x/day.  She ambulated ~60' with one seated rest break, but would not participate in grooming or other ADL activities       Vision                     Perception     Praxis      Cognition   Behavior During Therapy: Flat affect Overall Cognitive Status: Within Functional Limits for tasks assessed                       Extremity/Trunk Assessment               Exercises     Shoulder Instructions       General Comments      Pertinent Vitals/ Pain       Pain Assessment: No/denies pain  Home Living                                          Prior Functioning/Environment              Frequency Min 2X/week     Progress Toward Goals  OT Goals(current goals can now be found in the care plan section)        Plan Discharge plan remains appropriate    Co-evaluation                 End of Session Equipment Utilized During Treatment: Rolling walker   Activity Tolerance Patient limited by fatigue   Patient Left in chair;with call bell/phone within reach;with family/visitor present;with nursing/sitter in room   Nurse Communication Mobility status        Time:  1941-7408 OT Time Calculation (min): 18 min  Charges: OT General Charges $OT Visit: 1 Procedure OT Treatments $Therapeutic Activity: 23-37 mins  Tora Prunty M 08/20/2014, 4:20 PM

## 2014-08-21 LAB — BASIC METABOLIC PANEL
ANION GAP: 9 (ref 5–15)
BUN: 7 mg/dL (ref 6–23)
CHLORIDE: 108 mmol/L (ref 96–112)
CO2: 20 mmol/L (ref 19–32)
Calcium: 8.1 mg/dL — ABNORMAL LOW (ref 8.4–10.5)
Creatinine, Ser: 0.88 mg/dL (ref 0.50–1.10)
GFR calc Af Amer: 62 mL/min — ABNORMAL LOW (ref >90)
GFR, EST NON AFRICAN AMERICAN: 54 mL/min — AB (ref >90)
GLUCOSE: 106 mg/dL — AB (ref 70–99)
Potassium: 3.1 mmol/L — ABNORMAL LOW (ref 3.5–5.1)
Sodium: 137 mmol/L (ref 135–145)

## 2014-08-21 LAB — CBC
HEMATOCRIT: 26.4 % — AB (ref 36.0–46.0)
Hemoglobin: 8.3 g/dL — ABNORMAL LOW (ref 12.0–15.0)
MCH: 25.9 pg — AB (ref 26.0–34.0)
MCHC: 31.4 g/dL (ref 30.0–36.0)
MCV: 82.5 fL (ref 78.0–100.0)
PLATELETS: 257 10*3/uL (ref 150–400)
RBC: 3.2 MIL/uL — ABNORMAL LOW (ref 3.87–5.11)
RDW: 21 % — ABNORMAL HIGH (ref 11.5–15.5)
WBC: 11.7 10*3/uL — AB (ref 4.0–10.5)

## 2014-08-21 MED ORDER — METOPROLOL SUCCINATE ER 50 MG PO TB24
50.0000 mg | ORAL_TABLET | Freq: Every day | ORAL | Status: DC
  Administered 2014-08-22 – 2014-08-25 (×4): 50 mg via ORAL
  Filled 2014-08-21 (×4): qty 1

## 2014-08-21 MED ORDER — FAMOTIDINE 20 MG PO TABS
20.0000 mg | ORAL_TABLET | Freq: Every day | ORAL | Status: DC
Start: 2014-08-22 — End: 2014-08-25
  Administered 2014-08-22 – 2014-08-25 (×4): 20 mg via ORAL
  Filled 2014-08-21 (×4): qty 1

## 2014-08-21 MED ORDER — FUROSEMIDE 10 MG/ML IJ SOLN
40.0000 mg | Freq: Every day | INTRAMUSCULAR | Status: DC
  Administered 2014-08-21 – 2014-08-22 (×2): 40 mg via INTRAVENOUS
  Filled 2014-08-21 (×2): qty 4

## 2014-08-21 MED ORDER — POTASSIUM CHLORIDE CRYS ER 20 MEQ PO TBCR
40.0000 meq | EXTENDED_RELEASE_TABLET | Freq: Two times a day (BID) | ORAL | Status: AC
  Administered 2014-08-21 (×2): 40 meq via ORAL
  Filled 2014-08-21 (×2): qty 2

## 2014-08-21 MED ORDER — LEVOTHYROXINE SODIUM 88 MCG PO TABS
88.0000 ug | ORAL_TABLET | Freq: Every day | ORAL | Status: DC
  Administered 2014-08-22 – 2014-08-25 (×4): 88 ug via ORAL
  Filled 2014-08-21 (×4): qty 1

## 2014-08-21 MED ORDER — LEVOFLOXACIN 500 MG PO TABS
500.0000 mg | ORAL_TABLET | ORAL | Status: DC
  Administered 2014-08-23 – 2014-08-25 (×2): 500 mg via ORAL
  Filled 2014-08-21 (×2): qty 1

## 2014-08-21 MED ORDER — LEVOFLOXACIN 750 MG PO TABS
750.0000 mg | ORAL_TABLET | Freq: Once | ORAL | Status: AC
Start: 2014-08-21 — End: 2014-08-21
  Administered 2014-08-21: 750 mg via ORAL
  Filled 2014-08-21: qty 1

## 2014-08-21 NOTE — Progress Notes (Signed)
Patient ID: Cheryl Larson, female   DOB: 1915-08-12, 81 y.o.   MRN: [id number]     [facility]      [address] [place], [place] [address]    Phone: [phone] FAX: [phone]     Subjective: Events from yesterday noted, on lasix for fluid overload.  WBC is down.  Afebrile.    Objective:  Vital signs:  Filed Vitals:   08/19/14 2143 08/20/14 0556 08/20/14 2236 08/21/14 0612  BP: 145/72 132/57 180/92 143/75  Pulse: 52 75 73 78  Temp: 97.4 F (36.3 C) 98 F (36.7 C) 98 F (36.7 C) 97.8 F (36.6 C)  TempSrc: Axillary Oral Oral Oral  Resp: 18 18 18 18   Height:      Weight:      SpO2: 82% 98% 96% 97%    Last BM Date: 08/20/14  Intake/Output   Yesterday:  03/16 0701 - 03/17 0700 In: 960 [P.O.:960] Out: -  This shift:    I/O last 3 completed shifts: In: 40 [P.O.:960] Out: -     Physical Exam: General: Pt awake/alert/oriented x4 in no acute distress Abdomen: Soft. +bs. Mildly distended. Mild TTP. Midline incision-dressing removed again, staples in place.  +BS. No evidence of peritonitis. No incarcerated hernias.     Problem List:   Principal Problem:   Colonic mass Active Problems:   Anemia, iron deficiency   Essential hypertension   Hypothyroidism   Chronic atrial fibrillation   Partial small bowel obstruction   Abdominal mass, RUQ (right upper quadrant)    Results:   Labs: Results for orders placed or performed during the hospital encounter of 08/11/14 (from the past 48 hour(s))  CBC     Status: Abnormal   Collection Time: 08/20/14  6:14 AM  Result Value Ref Range   WBC 16.1 (H) 4.0 - 10.5 K/uL   RBC 3.30 (L) 3.87 - 5.11 MIL/uL   Hemoglobin 8.6 (L) 12.0 - 15.0 g/dL   HCT 27.2 (L) 36.0 - 46.0 %   MCV 82.4 78.0 - 100.0 fL   MCH 26.1 26.0 - 34.0 pg   MCHC 31.6 30.0 - 36.0 g/dL   RDW 20.7 (H) 11.5 - 15.5 %   Platelets 255 150 - 400 K/uL  Basic metabolic panel     Status: Abnormal   Collection Time: 08/20/14  6:14 AM  Result Value Ref Range   Sodium 138 135 - 145 mmol/L   Potassium 3.0 (L) 3.5 - 5.1 mmol/L   Chloride 112 96 - 112 mmol/L   CO2 20 19 - 32 mmol/L   Glucose, Bld 105 (H) 70 - 99 mg/dL   BUN 7 6 - 23 mg/dL   Creatinine, Ser 0.77 0.50 - 1.10 mg/dL   Calcium 8.1 (L) 8.4 - 10.5 mg/dL   GFR calc non Af Amer 69 (L) >90 mL/min   GFR calc Af Amer 80 (L) >90 mL/min    Comment: (NOTE) The eGFR has been calculated using the CKD EPI equation. This calculation has not been validated in all clinical situations. eGFR's persistently <90 mL/min signify possible Chronic Kidney Disease.    Anion gap 6 5 - 15  Urinalysis, Routine w reflex microscopic     Status: Abnormal   Collection Time: 08/20/14  5:36 PM  Result Value Ref Range   Color, Urine YELLOW YELLOW   APPearance CLEAR CLEAR   Specific Gravity, Urine 1.018 1.005 - 1.030   pH 5.5 5.0 - 8.0  Glucose, UA NEGATIVE NEGATIVE mg/dL   Hgb urine dipstick NEGATIVE NEGATIVE   Bilirubin Urine NEGATIVE NEGATIVE   Ketones, ur NEGATIVE NEGATIVE mg/dL   Protein, ur 30 (A) NEGATIVE mg/dL   Urobilinogen, UA 0.2 0.0 - 1.0 mg/dL   Nitrite NEGATIVE NEGATIVE   Leukocytes, UA NEGATIVE NEGATIVE  Creatinine, urine, random     Status: None   Collection Time: 08/20/14  5:36 PM  Result Value Ref Range   Creatinine, Urine 85.87 mg/dL  Urine microscopic-add on     Status: Abnormal   Collection Time: 08/20/14  5:36 PM  Result Value Ref Range   Squamous Epithelial / LPF RARE RARE   WBC, UA 0-2 <3 WBC/hpf   Bacteria, UA MANY (A) RARE   Casts HYALINE CASTS (A) NEGATIVE  CBC     Status: Abnormal   Collection Time: 08/21/14  6:30 AM  Result Value Ref Range   WBC 11.7 (H) 4.0 - 10.5 K/uL   RBC 3.20 (L) 3.87 - 5.11 MIL/uL   Hemoglobin 8.3 (L) 12.0 - 15.0 g/dL   HCT 26.4 (L) 36.0 - 46.0 %   MCV 82.5 78.0 - 100.0 fL   MCH 25.9 (L) 26.0 - 34.0 pg   MCHC 31.4 30.0 - 36.0 g/dL   RDW 21.0 (H) 11.5 - 15.5 %   Platelets 257 150 -  400 K/uL  Basic metabolic panel     Status: Abnormal   Collection Time: 08/21/14  6:30 AM  Result Value Ref Range   Sodium 137 135 - 145 mmol/L   Potassium 3.1 (L) 3.5 - 5.1 mmol/L   Chloride 108 96 - 112 mmol/L   CO2 20 19 - 32 mmol/L   Glucose, Bld 106 (H) 70 - 99 mg/dL   BUN 7 6 - 23 mg/dL   Creatinine, Ser 0.88 0.50 - 1.10 mg/dL   Calcium 8.1 (L) 8.4 - 10.5 mg/dL   GFR calc non Af Amer 54 (L) >90 mL/min   GFR calc Af Amer 62 (L) >90 mL/min    Comment: (NOTE) The eGFR has been calculated using the CKD EPI equation. This calculation has not been validated in all clinical situations. eGFR's persistently <90 mL/min signify possible Chronic Kidney Disease.    Anion gap 9 5 - 15    Imaging / Studies: Dg Chest 2 View  08/20/2014   CLINICAL DATA:  Hypertension and atrial fibrillation  EXAM: CHEST  2 VIEW  COMPARISON:  August 18, 2014 as well as CT abdomen and pelvis including lung bases August 11, 2014  FINDINGS: There are pleural effusions in each lung base with mild bibasilar edema and atelectasis, slightly more on the left than on the right. There is cardiomegaly. The pulmonary vascularity is within normal limits. There is a large paraesophageal type hernia, better seen on recent CT. Bones are osteoporotic. No adenopathy.  IMPRESSION: Findings felt to represent a degree of underlying congestive heart failure. Atelectasis in each lung base. No frank airspace consolidation. Paraesophageal type hernia, better seen on recent CT.   Electronically Signed   By: Lowella Grip III M.D.   On: 08/20/2014 14:38    Medications / Allergies:  Scheduled Meds: . ceFEPime (MAXIPIME) IV  1 g Intravenous Q24H  . famotidine (PEPCID) IV  20 mg Intravenous Q24H  . feeding supplement (RESOURCE BREEZE)  1 Container Oral TID BM  . heparin subcutaneous  5,000 Units Subcutaneous 3 times per day  . levothyroxine  44 mcg Intravenous Daily  . metoprolol  2.5  mg Intravenous Q12H  . sodium chloride  3 mL  Intravenous Q12H  . vancomycin  750 mg Intravenous Q24H   Continuous Infusions:  PRN Meds:.sodium chloride, acetaminophen, HYDROcodone-acetaminophen, morphine injection, ondansetron **OR** ondansetron (ZOFRAN) IV, sodium chloride  Antibiotics: Anti-infectives    Start     Dose/Rate Route Frequency Ordered Stop   08/18/14 1800  vancomycin (VANCOCIN) IVPB 750 mg/150 ml premix     750 mg 150 mL/hr over 60 Minutes Intravenous Every 24 hours 08/18/14 1547     08/18/14 1700  ceFEPIme (MAXIPIME) 1 g in dextrose 5 % 50 mL IVPB     1 g 100 mL/hr over 30 Minutes Intravenous Every 24 hours 08/18/14 1547     08/14/14 0830  [MAR Hold]  cefoTEtan (CEFOTAN) 2 g in dextrose 5 % 50 mL IVPB     (MAR Hold since 08/14/14 0818)  Comments:  Pharmacy may adjust dose strength for optimal dosing.   Send with patient on call to the OR.  Anesthesia to complete antibiotic administration <90mn [facility]   2 g 100 mL/hr over 30 Minutes Intravenous On call to O.R. 08/14/14 0756 08/14/14 0930      Assessment/Plan Locally invasive adeno Large right colon mass with partial stricture POD#7 right hemicolectomy ---Dr. TGeorgette DoverPost op ileus -tolerating solids, having BMs, diarrhea improved, pain controlled, appetite is fair, mobilizing.  WBC is trending down and abdominal exam is benign.  No need for CT of abdomen at this point.  -mobilize -IS  -SCD/heparin -anti-emetics -DC staples on POD#10--leave incision open to air -Dr. TGeorgette Doverdiscussed results with the patient. Dr. EMarin Olpfollows. No further treatment given age -further management per primary team  EErby Pian ABaylor [facility] & White [facility] At ShermanSurgery Pager [phone](7A-4:30P) For consults and floor pages call [phone](7A-4:30P)  08/21/2014 8:34 AM

## 2014-08-21 NOTE — Progress Notes (Signed)
ANTIBIOTIC CONSULT NOTE - INITIAL  Pharmacy Consult for levaquin PO Indication: HCAP  Allergies  Allergen Reactions  . Sulfa Antibiotics Hives    Patient Measurements: Height: 5\' 2"  (157.5 cm) Weight: 132 lb 7.9 oz (60.1 kg) IBW/kg (Calculated) : 50.1   Vital Signs: Temp: 97.8 F (36.6 C) (03/17 0612) Temp Source: Oral (03/17 0612) BP: 143/75 mmHg (03/17 0612) Pulse Rate: 78 (03/17 0612) Intake/Output from previous day: 03/16 0701 - 03/17 0700 In: 960 [P.O.:960] Out: -  Intake/Output from this shift:    Labs:  Recent Labs  08/19/14 0440 08/20/14 0614 08/20/14 1736 08/21/14 0630  WBC 13.4* 16.1*  --  11.7*  HGB 9.0* 8.6*  --  8.3*  PLT 288 255  --  257  LABCREA  --   --  85.87  --   CREATININE  --  0.77  --  0.88   Estimated Creatinine Clearance: 29.6 mL/min (by C-G formula based on Cr of 0.88). No results for input(s): VANCOTROUGH, VANCOPEAK, VANCORANDOM, GENTTROUGH, GENTPEAK, GENTRANDOM, TOBRATROUGH, TOBRAPEAK, TOBRARND, AMIKACINPEAK, AMIKACINTROU, AMIKACIN in the last 72 hours.   Microbiology: Recent Results (from the past 720 hour(s))  Urine culture     Status: None   Collection Time: 08/04/14  4:39 PM  Result Value Ref Range Status   Urine Culture, Routine Final report  Final   Result 1 Comment  Final    Comment: Mixed urogenital flora 25,000-50,000 colony forming units per mL   Clostridium Difficile by PCR     Status: None   Collection Time: 08/13/14  6:11 PM  Result Value Ref Range Status   C difficile by pcr NEGATIVE NEGATIVE Final  Surgical pcr screen     Status: None   Collection Time: 08/13/14  7:19 PM  Result Value Ref Range Status   MRSA, PCR NEGATIVE NEGATIVE Final   Staphylococcus aureus NEGATIVE NEGATIVE Final    Comment:        The Xpert SA Assay (FDA approved for NASAL specimens in patients over 86 years of age), is one component of a comprehensive surveillance program.  Test performance has been validated by [facility] for  patients greater than or equal to 32 year old. It is not intended to diagnose infection nor to guide or monitor treatment.   Culture, blood (routine x 2)     Status: None (Preliminary result)   Collection Time: 08/18/14  6:12 PM  Result Value Ref Range Status   Specimen Description BLOOD LEFT ARM  Final   Special Requests BOTTLES DRAWN AEROBIC AND ANAEROBIC 5CC  Final   Culture   Final           BLOOD CULTURE RECEIVED NO GROWTH TO DATE CULTURE WILL BE HELD FOR 5 DAYS BEFORE ISSUING A FINAL NEGATIVE REPORT Performed at [facility]    Report Status PENDING  Incomplete  Culture, blood (routine x 2)     Status: None (Preliminary result)   Collection Time: 08/18/14  6:20 PM  Result Value Ref Range Status   Specimen Description BLOOD RIGHT ARM  Final   Special Requests BOTTLES DRAWN AEROBIC AND ANAEROBIC 5CC  Final   Culture   Final           BLOOD CULTURE RECEIVED NO GROWTH TO DATE CULTURE WILL BE HELD FOR 5 DAYS BEFORE ISSUING A FINAL NEGATIVE REPORT Performed at [facility]    Report Status PENDING  Incomplete    Medical History: Past Medical History  Diagnosis Date  .  Thyroid disease   . Hypertension   . A-fib   . Blood transfusion without reported diagnosis    Assessment: Pharmacy consulted to change vancomycin/cefepime to PO levaquin for HCAP.  81 yo F.  Wt 60 kg, creat 0.88; WBC down to 11.7.  AF.  Plan:  -levaquin 750 mg po x 1 then 500 mg po q48h -change IV synthroid and pepcid to PO -pharmacy will sign off Thanks  Eudelia Bunch, Pharm.D. [phone] 08/21/2014 11:19 AM

## 2014-08-21 NOTE — Progress Notes (Signed)
Patient ID: Cheryl Larson, female   DOB: [birth year], 81 y.o.   MRN: [id number]  [facility] PROGRESS NOTE  MIARA EMMINGER DXI:[id number] DOB: 11-04-1915 DOA: 08/11/2014 PCP: Claretta Fraise, MD   Brief narrative:    81 year old female with a history of atrial fibrillation (not on anticoagulation), hypothyroidism admitted for evaluation of diarrhea. CT abdomen showed a large mass at the hepatic flexure. General surgery was consulted, cardiology was also consulted for preoperative clearance. 2-D echocardiogram revealed normal ejection fraction. Patient underwent right hemicolectomy on 08/14/14. Unfortunately postoperative course as been complicated by ileus that has now started to resolve, and also by development of hospital-acquired pneumonia. Plans are to discharge to SNF when medically ready.  Assessment/Plan:    Principal Problem:   Colonic mass, biopsy positive for adenocarcinoma  - status post rught hemicolectomy, post op day #7, pt is clinically stable this AM but still volume overloaded - IVF have been stopped and pt currently on soft diet, no concerns  Active Problems:   LE swelling - from volume overload - weight was 132 lbs 3/16 which is up from 116 lbs on admission - given one dose lasix 20 mg IV, weight pending this AM   Post op ileus - slowly resolving with supportive care - advance diet to soft, so far tolerating well    Hypokalemia - continue to supplement and repeat BMP in AM   Acute hypoxic respiratory failure - secondary to HCAP and new vascular congestion from 3/16 - CXR consistent with vascular congestion  - will narrow down ABX to oral Levaquin and will wait on weight today before giving additional dose of Lasix  - WBC trending down    Essential hypertension - reasonable inpatient control  - restart home metoprolol XL 50 mg daily and d/c IV metoprolol    Hypothyroidism - continue synthroid    Anemia of chronic disease, colon ca - overall Hg 8 - 9 - no  sings of active bleeding but Hg is slightly down over the past 48 hours  - CBC in AM History of atrial fibrillation - Rate controlled with metoprolol - previously on coumadin. CHADS2VASC score of 4 - no longer on AC given frail condition, risk of fall  Large splenic infarct - seen on CT scan. Not a candidate for long-term anticoagulation - asymptomatic without any abdominal pain   Severe PCM - in the context of acute on chronic illness and progressive failure to thrive - tolerating soft diet well   DVT prophylaxis: Heparin SQ  Code Status: Full.  Family Communication:  plan of care discussed with daughter over the phone  Disposition Plan: SNF when volume overload resolved   IV access:  Peripheral IV  Procedures and diagnostic studies:    CXR 08/20/2014 Findings felt to represent a degree of underlying congestive heart failure. Atelectasis in each lung base. No frank airspace consolidation. Paraesophageal type hernia, better seen on recent CT.    Dg Abd 1 View   08/04/2014  No abnormality to correspond with the patient's history.     Ct Abdomen Pelvis W Contrast  08/11/2014    Large mass at hepatic flexure of colon measuring 9.6 x 6.3 x 6.3 cm in size consistent with a colonic neoplasm, causing colonic and small bowel obstruction.  Minimal peritumoral infiltration could be related to edema/ascites though tumor extension is not excluded.  Minimal ascites.  Atrophic pancreas.  Additionally, large splenic infarct with nonvisualization of the splenic vein and note of scattered collaterals in the perisplenic  region.   CXR  08/18/2014  Bilateral lower lobe airspace opacities and effusions, right greater than left. This could represent asymmetric edema or infection. Given the patient's history of leukocytosis, pneumonia is favored.     Dg Abd Portable 1v  08/17/2014   Suspected mild postoperative ileus.     Medical Consultants:  Surgery   Other Consultants:  PT  IAnti-Infectives:    Vancomycin 3/14 --> 3/17 Cefepime 3/14 -->  3/17 Levaquin 3/17 -->  Faye Ramsay, MD  [facility] Pager [phone]  If 7PM-7AM, please contact night-coverage [email or web address] Password [facility] 08/21/2014, 10:49 AM   LOS: 9 days   HPI/Subjective: No events overnight.   Objective: Filed Vitals:   08/19/14 2143 08/20/14 0556 08/20/14 2236 08/21/14 0612  BP: 145/72 132/57 180/92 143/75  Pulse: 52 75 73 78  Temp: 97.4 F (36.3 C) 98 F (36.7 C) 98 F (36.7 C) 97.8 F (36.6 C)  TempSrc: Axillary Oral Oral Oral  Resp: 18 18 18 18   Height:      Weight:      SpO2: 82% 98% 96% 97%    Intake/Output Summary (Last 24 hours) at 08/21/14 1049 Last data filed at 08/20/14 1900  Gross per 24 hour  Intake    480 ml  Output      0 ml  Net    480 ml    Exam:   General:  Pt is alert, follows commands appropriately, not in acute distress  Cardiovascular: Regular rate and rhythm, no rubs, no gallops  Respiratory: Clear to auscultation bilaterally, no wheezing, bibasilar crackles still present   Abdomen: Soft, non tender, non distended, no guarding  Extremities: +2 bilateral LE pitting edema, pulses DP and PT palpable bilaterally  Neuro: Grossly nonfocal  Data Reviewed: Basic Metabolic Panel:  Recent Labs Lab 08/15/14 0227 08/16/14 0613 08/18/14 0536 08/20/14 0614 08/21/14 0630  NA 139 138 141 138 137  K 5.4* 3.9 3.5 3.0* 3.1*  CL 110 111 115* 112 108  CO2 23 19 19 20 20   GLUCOSE 142* 135* 140* 105* 106*  BUN 11 12 11 7 7   CREATININE 1.21* 1.03 0.80 0.77 0.88  CALCIUM 8.4 8.4 7.7* 8.1* 8.1*   CBC:  Recent Labs Lab 08/17/14 0508 08/18/14 0536 08/19/14 0440 08/20/14 0614 08/21/14 0630  WBC 14.6* 15.1* 13.4* 16.1* 11.7*  HGB 10.3* 9.6* 9.0* 8.6* 8.3*  HCT 34.1* 30.2* 29.9* 27.2* 26.4*  MCV 83.2 83.0 83.8 82.4 82.5  PLT 368 319 288 255 257     Recent Results (from the past 240 hour(s))  Clostridium Difficile by PCR     Status: None   Collection Time: 08/13/14   6:11 PM  Result Value Ref Range Status   C difficile by pcr NEGATIVE NEGATIVE Final  Surgical pcr screen     Status: None   Collection Time: 08/13/14  7:19 PM  Result Value Ref Range Status   MRSA, PCR NEGATIVE NEGATIVE Final   Staphylococcus aureus NEGATIVE NEGATIVE Final  Culture, blood (routine x 2)     Status: None (Preliminary result)   Collection Time: 08/18/14  6:12 PM  Result Value Ref Range Status   Specimen Description BLOOD LEFT ARM  Final   Report Status PENDING  Incomplete  Culture, blood (routine x 2)     Status: None (Preliminary result)   Collection Time: 08/18/14  6:20 PM  Result Value Ref Range Status   Specimen Description BLOOD RIGHT ARM  Final   Culture   Final  Report Status PENDING  Incomplete     Scheduled Meds: . famotidine (PEPCID) IV  20 mg Intravenous Q24H  . feeding supplement (RESOURCE BREEZE)  1 Container Oral TID BM  . heparin subcutaneous  5,000 Units Subcutaneous 3 times per day  . levothyroxine  44 mcg Intravenous Daily  . metoprolol  2.5 mg Intravenous Q12H  . potassium chloride  40 mEq Oral BID  . sodium chloride  3 mL Intravenous Q12H    Continuous Infusions:

## 2014-08-21 NOTE — Progress Notes (Signed)
Physical Therapy Treatment Patient Details Name: Cheryl Larson MRN: [id number] DOB: 08-10-1915 Today's Date: 08/21/2014    History of Present Illness Pt admit with colon mass.  Hemicolectomy performed.     PT Comments    Patient progressing slowly but well. Requires increased time and rest breaks due to fatigue. Continue to recommend SNF for ongoing Physical Therapy.     Follow Up Recommendations  SNF     Equipment Recommendations  3in1 (PT)    Recommendations for Other Services       Precautions / Restrictions Precautions Precautions: Fall    Mobility  Bed Mobility Overal bed mobility: Needs Assistance         Sit to supine: Min assist   General bed mobility comments: Min A for trunk support up into sitting  Transfers Overall transfer level: Needs assistance Equipment used: Rolling walker (2 wheeled)   Sit to Stand: Min guard         General transfer comment: verbal cues for hand placement  Ambulation/Gait Ambulation/Gait assistance: Min guard Ambulation Distance (Feet): 50 Feet (x3) Assistive device: Rolling walker (2 wheeled) Gait Pattern/deviations: Step-through pattern;Decreased stride length;Trunk flexed     General Gait Details: Pt with flexed posture.  Pt takes small steps and needed cues for RW safety. Patient required 2 seated rest breaks as she fatigues quickly   Stairs            Wheelchair Mobility    Modified Rankin (Stroke Patients Only)       Balance                                    Cognition Arousal/Alertness: Awake/alert Behavior During Therapy: WFL for tasks assessed/performed Overall Cognitive Status: Within Functional Limits for tasks assessed                      Exercises      General Comments        Pertinent Vitals/Pain Pain Assessment: No/denies pain    Home Living                      Prior Function            PT Goals (current goals can now be found in  the care plan section) Progress towards PT goals: Progressing toward goals    Frequency  Min 3X/week    PT Plan Current plan remains appropriate    Co-evaluation             End of Session Equipment Utilized During Treatment: Gait belt Activity Tolerance: Patient tolerated treatment well Patient left: in chair;with call bell/phone within reach     Time: 0749-0813 PT Time Calculation (min) (ACUTE ONLY): 24 min  Charges:  $Gait Training: 8-22 mins $Therapeutic Activity: 8-22 mins                    G Codes:      Jacqualyn Posey 08/21/2014, 9:03 AM  08/21/2014 Jacqualyn Posey PTA [phone] pager [phone] office

## 2014-08-21 NOTE — Progress Notes (Signed)
NUTRITION FOLLOW UP/CONSULT  Intervention:    -Resource Breeze PO TID each providing 250 kcal and 9 g protein  -Magic Cup BID each providing 290 kcal and 9 g protein   Nutrition Dx:   Inadequate oral intake related to poor appetite as evidenced by PO: 0-5%; ongoing.  Goal:   Pt will meet >90% of estimated nutritional needs; not met.  Monitor:   PO/supplement intake, labs, weight changes, I/O's  Assessment:   Pt with history of A fib, hypothyroidsm admitted for diarrhea.  3/15 She reports decreased appetite PTA due to abdominal pain. She typically follows a regular diet at home and eats well, reporting his daughter is a very good cook. She reveals a hx of weight loss over the past several years, prior to moving in with her daughter. Wt loss is not significant over the past year, in fact, noted wt gain trend. Suspect muscle depletion is due to advanced age. She is concerned about weight loss and is interested in trying nutritional supplements to maximize calorie and protein intake. She has tried [employer] in the past and prefers this over Ensure and Boost.  3/17 Pt currently on SOFT diet and consuming 0-25%.  Pt says she would eat more but complains about the food being cold. Reports family brought her a hamburger the other day and she ate it all and it did well on her stomach. Does enjoy fresh fruit, applesauce and the cranberry juice. Advised pt to try ordering foods that are typically served cold if possible.  Pt reports drinking [employer] and she enjoys them.  Will try also adding Magic Cup to each tray.   Consulted by MD for assessment of nutritional status. Wt is stable for the past year and pt has even gained 16 lbs in the past month.  Labs reviewed: K and Ca low  Height: Ht Readings from Last 1 Encounters:  08/14/14 $RemoveB'5\' 2"'gzWvnvWR$  (1.575 m)    Weight Status:   Wt Readings from Last 1 Encounters:  08/14/14 132 lb 7.9 oz (60.1 kg)    Re-estimated needs:  Kcal:  1600-1800 Protein: 75-85 grams Fluid: 1.6-1.8 L  Skin: closed abdominal incision  Diet Order: DIET SOFT   Intake/Output Summary (Last 24 hours) at 08/21/14 1453 Last data filed at 08/21/14 1347  Gross per 24 hour  Intake    477 ml  Output      2 ml  Net    475 ml    Last BM: 3/16   Labs:   Recent Labs Lab 08/18/14 0536 08/20/14 0614 08/21/14 0630  NA 141 138 137  K 3.5 3.0* 3.1*  CL 115* 112 108  CO2 $Re'19 20 20  'dgq$ BUN $R'11 7 7  'zX$ CREATININE 0.80 0.77 0.88  CALCIUM 7.7* 8.1* 8.1*  GLUCOSE 140* 105* 106*    CBG (last 3)  No results for input(s): GLUCAP in the last 72 hours.  Scheduled Meds: . [START ON 08/22/2014] famotidine  20 mg Oral Daily  . feeding supplement (RESOURCE BREEZE)  1 Container Oral TID BM  . heparin subcutaneous  5,000 Units Subcutaneous 3 times per day  . [START ON 08/23/2014] levofloxacin  500 mg Oral Q48H  . [START ON 08/22/2014] levothyroxine  88 mcg Oral QAC breakfast  . metoprolol succinate  50 mg Oral Daily  . potassium chloride  40 mEq Oral BID  . sodium chloride  3 mL Intravenous Q12H    Continuous Infusions:   Elmer Picker MS Dietetic Intern Pager Number  [phone]     

## 2014-08-22 ENCOUNTER — Inpatient Hospital Stay (HOSPITAL_COMMUNITY): Payer: Medicare Other

## 2014-08-22 LAB — CBC
HEMATOCRIT: 31.1 % — AB (ref 36.0–46.0)
Hemoglobin: 9.7 g/dL — ABNORMAL LOW (ref 12.0–15.0)
MCH: 25.5 pg — AB (ref 26.0–34.0)
MCHC: 31.2 g/dL (ref 30.0–36.0)
MCV: 81.8 fL (ref 78.0–100.0)
Platelets: 312 10*3/uL (ref 150–400)
RBC: 3.8 MIL/uL — ABNORMAL LOW (ref 3.87–5.11)
RDW: 21.5 % — ABNORMAL HIGH (ref 11.5–15.5)
WBC: 15.7 10*3/uL — ABNORMAL HIGH (ref 4.0–10.5)

## 2014-08-22 LAB — BASIC METABOLIC PANEL
Anion gap: 10 (ref 5–15)
BUN: 7 mg/dL (ref 6–23)
CALCIUM: 8.4 mg/dL (ref 8.4–10.5)
CO2: 24 mmol/L (ref 19–32)
CREATININE: 0.94 mg/dL (ref 0.50–1.10)
Chloride: 101 mmol/L (ref 96–112)
GFR calc Af Amer: 57 mL/min — ABNORMAL LOW (ref >90)
GFR calc non Af Amer: 50 mL/min — ABNORMAL LOW (ref >90)
GLUCOSE: 121 mg/dL — AB (ref 70–99)
Potassium: 3 mmol/L — ABNORMAL LOW (ref 3.5–5.1)
SODIUM: 135 mmol/L (ref 135–145)

## 2014-08-22 MED ORDER — POTASSIUM CHLORIDE CRYS ER 20 MEQ PO TBCR
40.0000 meq | EXTENDED_RELEASE_TABLET | Freq: Two times a day (BID) | ORAL | Status: AC
  Administered 2014-08-22 (×2): 40 meq via ORAL
  Filled 2014-08-22 (×2): qty 2

## 2014-08-22 MED ORDER — FUROSEMIDE 10 MG/ML IJ SOLN: 40.0000 mg | Freq: Every day | INTRAMUSCULAR | Status: DC

## 2014-08-22 NOTE — Progress Notes (Signed)
Patient is wearing briefs for her incontinence however she needs reminding to not have the brief cover her abdominal incision.

## 2014-08-22 NOTE — Progress Notes (Signed)
8 Days Post-Op  Subjective: She denies pain Having BM's Tolerating po  Objective: Vital signs in last 24 hours: Temp:  [97.3 F (36.3 C)-98.2 F (36.8 C)] 98.1 F (36.7 C) (03/18 0500) Pulse Rate:  [70-80] 80 (03/18 0922) Resp:  [18-32] 32 (03/18 0922) BP: (140-148)/(70-78) 140/78 mmHg (03/18 0500) SpO2:  [95 %-97 %] 95 % (03/18 0500) Weight:  [64.592 kg (142 lb 6.4 oz)-64.6 kg (142 lb 6.7 oz)] 64.592 kg (142 lb 6.4 oz) (03/18 0500) Last BM Date: 08/20/14  Intake/Output from previous day: 03/17 0701 - 03/18 0700 In: 474 [P.O.:474] Out: 7 [Urine:6; Stool:1] Intake/Output this shift: Total I/O In: -  Out: 1 [Stool:1]  Abdomen is soft with minimal tenderness Incision healing well  Lab Results:   Recent Labs  08/21/14 0630 08/22/14 0433  WBC 11.7* 15.7*  HGB 8.3* 9.7*  HCT 26.4* 31.1*  PLT 257 312   BMET  Recent Labs  08/21/14 0630 08/22/14 0433  NA 137 135  K 3.1* 3.0*  CL 108 101  CO2 20 24  GLUCOSE 106* 121*  BUN 7 7  CREATININE 0.88 0.94  CALCIUM 8.1* 8.4   PT/INR No results for input(s): LABPROT, INR in the last 72 hours. ABG No results for input(s): PHART, HCO3 in the last 72 hours.  Invalid input(s): PCO2, PO2  Studies/Results: Dg Chest 2 View  08/20/2014   CLINICAL DATA:  Hypertension and atrial fibrillation  EXAM: CHEST  2 VIEW  COMPARISON:  August 18, 2014 as well as CT abdomen and pelvis including lung bases August 11, 2014  FINDINGS: There are pleural effusions in each lung base with mild bibasilar edema and atelectasis, slightly more on the left than on the right. There is cardiomegaly. The pulmonary vascularity is within normal limits. There is a large paraesophageal type hernia, better seen on recent CT. Bones are osteoporotic. No adenopathy.  IMPRESSION: Findings felt to represent a degree of underlying congestive heart failure. Atelectasis in each lung base. No frank airspace consolidation. Paraesophageal type hernia, better seen on recent  CT.   Electronically Signed   By: Lowella Grip III M.D.   On: 08/20/2014 14:38    Anti-infectives: Anti-infectives    Start     Dose/Rate Route Frequency Ordered Stop   08/23/14 1200  levofloxacin (LEVAQUIN) tablet 500 mg     500 mg Oral Every 48 hours 08/21/14 1114     08/21/14 1200  levofloxacin (LEVAQUIN) tablet 750 mg     750 mg Oral Once 08/21/14 1114 08/21/14 1246   08/18/14 1800  vancomycin (VANCOCIN) IVPB 750 mg/150 ml premix  Status:  Discontinued     750 mg 150 mL/hr over 60 Minutes Intravenous Every 24 hours 08/18/14 1547 08/21/14 1049   08/18/14 1700  ceFEPIme (MAXIPIME) 1 g in dextrose 5 % 50 mL IVPB  Status:  Discontinued     1 g 100 mL/hr over 30 Minutes Intravenous Every 24 hours 08/18/14 1547 08/21/14 1049   08/14/14 0830  [MAR Hold]  cefoTEtan (CEFOTAN) 2 g in dextrose 5 % 50 mL IVPB     (MAR Hold since 08/14/14 0818)  Comments:  Pharmacy may adjust dose strength for optimal dosing.   Send with patient on call to the OR.  Anesthesia to complete antibiotic administration <78min prior to incision per [facility].   2 g 100 mL/hr over 30 Minutes Intravenous On call to O.R. 08/14/14 0756 08/14/14 0930      Assessment/Plan: s/p Procedure(s): RIGHT HEMI COLECTOMY (Right)  Persistent elevated WBC.  Her abdomin is benign, but I feel like we still need a CT to r/o an intra-abdominal abscess.  Continue to ambulate Continue abx for pneumonia  LOS: 10 days    Lura Falor A 08/22/2014

## 2014-08-22 NOTE — Care Management (Signed)
IM from Medicare given to Patient. Magdalen Spatz RN BSN

## 2014-08-22 NOTE — Progress Notes (Signed)
Patient ID: Cheryl Larson, female   DOB: [birth year], 81 y.o.   MRN: [id number]  [facility] PROGRESS NOTE  Cheryl Larson WCB:[id number] DOB: [birth year] DOA: 08/11/2014 PCP: Claretta Fraise, MD   Brief narrative:    81 year old female with a history of atrial fibrillation (not on anticoagulation), hypothyroidism admitted for evaluation of diarrhea. CT abdomen showed a large mass at the hepatic flexure. General surgery was consulted, cardiology was also consulted for preoperative clearance. 2-D echocardiogram revealed normal ejection fraction. Patient underwent right hemicolectomy on 08/14/14. Unfortunately postoperative course as been complicated by ileus that has now started to resolve, and also by development of hospital-acquired pneumonia. Plans are to discharge to SNF when medically ready.  Assessment/Plan:    Principal Problem:   Colonic mass, biopsy positive for adenocarcinoma  - status post right hemicolectomy, post op day #8, pt is clinically stable this AM but still volume overloaded - IVF have been stopped 3/16 and pt currently on soft diet - concern is still persistent leukocytosis, may need CT abd per surgery team to rule out abscess  - will continue to follow up on recommendations  Active Problems:   LE swelling - from volume overload - weight is 142 lbs this AM and pt's baseline is 116 lbs (on admission) - continue Lasix but increase dose from 20 mg IV to 40 mg IV and monitor clinical response    Post op ileus - slowly resolving with supportive care - advance diet to soft, so far tolerating well    Hypokalemia - continue to supplement and repeat BMP in AM   Acute hypoxic respiratory failure - secondary to HCAP and new vascular congestion from 3/16 - CXR consistent with vascular congestion  - continue Levaquin until 3/21 - continue Lasix as well as noted above    Essential hypertension - restarted home metoprolol XL 50 mg daily    Hypothyroidism - continue  synthroid    Anemia of chronic disease, colon ca - overall Hg 8 - 9 - no sings of active bleeding - CBC in AM History of atrial fibrillation - Rate controlled with metoprolol - previously on coumadin. CHADS2VASC score of 4 - no longer on AC given frail condition, risk of fall  Large splenic infarct - seen on CT scan. Not a candidate for long-term anticoagulation - asymptomatic without any abdominal pain   Severe PCM - in the context of acute on chronic illness and progressive failure to thrive - tolerating soft diet well   DVT prophylaxis: Heparin SQ  Code Status: Full.  Family Communication:  plan of care discussed with daughter over the phone  Disposition Plan: SNF when volume overload resolved   IV access:  Peripheral IV  Procedures and diagnostic studies:    CXR 08/20/2014 Findings felt to represent a degree of underlying congestive heart failure. Atelectasis in each lung base. No frank airspace consolidation. Paraesophageal type hernia, better seen on recent CT.    Dg Abd 1 View   08/04/2014  No abnormality to correspond with the patient's history.     Ct Abdomen Pelvis W Contrast  08/11/2014    Large mass at hepatic flexure of colon measuring 9.6 x 6.3 x 6.3 cm in size consistent with a colonic neoplasm, causing colonic and small bowel obstruction.  Minimal peritumoral infiltration could be related to edema/ascites though tumor extension is not excluded.  Minimal ascites.  Atrophic pancreas.  Additionally, large splenic infarct with nonvisualization of the splenic vein and note of scattered collaterals in the  perisplenic region.   CXR  08/18/2014  Bilateral lower lobe airspace opacities and effusions, right greater than left. This could represent asymmetric edema or infection. Given the patient's history of leukocytosis, pneumonia is favored.     Dg Abd Portable 1v  08/17/2014   Suspected mild postoperative ileus.     Medical Consultants:  Surgery   Other Consultants:   PT  IAnti-Infectives:   Vancomycin 3/14 --> 3/17 Cefepime 3/14 -->  3/17 Levaquin 3/17 -->  Faye Ramsay, MD  [facility] Pager [phone]  If 7PM-7AM, please contact night-coverage [email or web address] Password [email or web address] 08/22/2014, 2:18 PM   LOS: 10 days   HPI/Subjective: No events overnight.   Objective: Filed Vitals:   08/21/14 1609 08/21/14 2150 08/22/14 0500 08/22/14 0922  BP:  148/70 140/78   Pulse:  79 70 80  Temp:  98.2 F (36.8 C) 98.1 F (36.7 C)   TempSrc:  Oral Oral   Resp:  18 18 32  Height:      Weight: 64.6 kg (142 lb 6.7 oz)  64.592 kg (142 lb 6.4 oz)   SpO2:  96% 95%     Intake/Output Summary (Last 24 hours) at 08/22/14 1418 Last data filed at 08/22/14 1116  Gross per 24 hour  Intake    237 ml  Output    206 ml  Net     31 ml    Exam:   General:  Pt is alert, follows commands appropriately, not in acute distress  Cardiovascular: Regular rate and rhythm, no rubs, no gallops  Respiratory: Clear to auscultation bilaterally, no wheezing, bibasilar crackles still present   Abdomen: Soft, non tender, non distended, no guarding  Extremities: +2 bilateral LE pitting edema, pulses DP and PT palpable bilaterally  Neuro: Grossly nonfocal  Data Reviewed: Basic Metabolic Panel:  Recent Labs Lab 08/16/14 0613 08/18/14 0536 08/20/14 0614 08/21/14 0630 08/22/14 0433  NA 138 141 138 137 135  K 3.9 3.5 3.0* 3.1* 3.0*  CL 111 115* 112 108 101  CO2 19 19 20 20 24   GLUCOSE 135* 140* 105* 106* 121*  BUN 12 11 7 7 7   CREATININE 1.03 0.80 0.77 0.88 0.94  CALCIUM 8.4 7.7* 8.1* 8.1* 8.4   CBC:  Recent Labs Lab 08/18/14 0536 08/19/14 0440 08/20/14 0614 08/21/14 0630 08/22/14 0433  WBC 15.1* 13.4* 16.1* 11.7* 15.7*  HGB 9.6* 9.0* 8.6* 8.3* 9.7*  HCT 30.2* 29.9* 27.2* 26.4* 31.1*  MCV 83.0 83.8 82.4 82.5 81.8  PLT 319 288 255 257 312     Recent Results (from the past 240 hour(s))  Clostridium Difficile by PCR     Status: None   Collection Time:  08/13/14  6:11 PM  Result Value Ref Range Status   C difficile by pcr NEGATIVE NEGATIVE Final  Surgical pcr screen     Status: None   Collection Time: 08/13/14  7:19 PM  Result Value Ref Range Status   MRSA, PCR NEGATIVE NEGATIVE Final   Staphylococcus aureus NEGATIVE NEGATIVE Final  Culture, blood (routine x 2)     Status: None (Preliminary result)   Collection Time: 08/18/14  6:12 PM  Result Value Ref Range Status   Specimen Description BLOOD LEFT ARM  Final   Report Status PENDING  Incomplete  Culture, blood (routine x 2)     Status: None (Preliminary result)   Collection Time: 08/18/14  6:20 PM  Result Value Ref Range Status   Specimen Description BLOOD RIGHT ARM  Final   Culture  Final   Report Status PENDING  Incomplete     Scheduled Meds: . famotidine  20 mg Oral Daily  . feeding supplement (RESOURCE BREEZE)  1 Container Oral TID BM  . furosemide  40 mg Intravenous Q1400  . heparin subcutaneous  5,000 Units Subcutaneous 3 times per day  . [START ON 08/23/2014] levofloxacin  500 mg Oral Q48H  . levothyroxine  88 mcg Oral QAC breakfast  . metoprolol succinate  50 mg Oral Daily  . sodium chloride  3 mL Intravenous Q12H    Continuous Infusions:

## 2014-08-22 NOTE — Clinical Social Work Note (Signed)
CSW still continuing to follow patient, patient and daughter would still like to go to [facility] once she is medically ready for discharge.  Jones Broom. [place], MSW, [place] 08/22/2014 5:02 PM

## 2014-08-23 LAB — BASIC METABOLIC PANEL
Anion gap: 5 (ref 5–15)
BUN: 6 mg/dL (ref 6–23)
CO2: 26 mmol/L (ref 19–32)
CREATININE: 0.88 mg/dL (ref 0.50–1.10)
Calcium: 8.1 mg/dL — ABNORMAL LOW (ref 8.4–10.5)
Chloride: 102 mmol/L (ref 96–112)
GFR calc Af Amer: 62 mL/min — ABNORMAL LOW (ref >90)
GFR calc non Af Amer: 54 mL/min — ABNORMAL LOW (ref >90)
Glucose, Bld: 96 mg/dL (ref 70–99)
Potassium: 4.1 mmol/L (ref 3.5–5.1)
Sodium: 133 mmol/L — ABNORMAL LOW (ref 135–145)

## 2014-08-23 LAB — CBC
HCT: 32.3 % — ABNORMAL LOW (ref 36.0–46.0)
Hemoglobin: 10.1 g/dL — ABNORMAL LOW (ref 12.0–15.0)
MCH: 26 pg (ref 26.0–34.0)
MCHC: 31.3 g/dL (ref 30.0–36.0)
MCV: 83 fL (ref 78.0–100.0)
Platelets: 224 10*3/uL (ref 150–400)
RBC: 3.89 MIL/uL (ref 3.87–5.11)
RDW: 22.2 % — ABNORMAL HIGH (ref 11.5–15.5)
WBC: 11.9 10*3/uL — AB (ref 4.0–10.5)

## 2014-08-23 MED ORDER — FUROSEMIDE 10 MG/ML IJ SOLN
40.0000 mg | Freq: Every morning | INTRAMUSCULAR | Status: DC
  Administered 2014-08-23 – 2014-08-24 (×2): 40 mg via INTRAVENOUS
  Filled 2014-08-23 (×2): qty 4

## 2014-08-23 NOTE — Progress Notes (Signed)
9 Days Post-Op  Subjective: No complaints  Objective: Vital signs in last 24 hours: Temp:  [98 F (36.7 C)-98.4 F (36.9 C)] 98 F (36.7 C) (03/19 0525) Pulse Rate:  [72-80] 79 (03/19 0525) Resp:  [19-32] 19 (03/19 0525) BP: (132-138)/(73-89) 138/89 mmHg (03/19 0525) SpO2:  [93 %-95 %] 94 % (03/19 0525) Weight:  [58.469 kg (128 lb 14.4 oz)-63.8 kg (140 lb 10.5 oz)] 63.8 kg (140 lb 10.5 oz) (03/19 0525) Last BM Date: 08/22/14  Intake/Output from previous day: 03/18 0701 - 03/19 0700 In: 480 [P.O.:477; I.V.:3] Out: 502 [Urine:500; Stool:2] Intake/Output this shift:    Resp: clear to auscultation bilaterally Cardio: regular rate and rhythm GI: soft, nontender. good bs. incision looks good  Lab Results:   Recent Labs  08/22/14 0433 08/23/14 0435  WBC 15.7* 11.9*  HGB 9.7* 10.1*  HCT 31.1* 32.3*  PLT 312 224   BMET  Recent Labs  08/22/14 0433 08/23/14 0435  NA 135 133*  K 3.0* 4.1  CL 101 102  CO2 24 26  GLUCOSE 121* 96  BUN 7 6  CREATININE 0.94 0.88  CALCIUM 8.4 8.1*   PT/INR No results for input(s): LABPROT, INR in the last 72 hours. ABG No results for input(s): PHART, HCO3 in the last 72 hours.  Invalid input(s): PCO2, PO2  Studies/Results: Ct Abdomen Pelvis Wo Contrast  08/22/2014   CLINICAL DATA:  Postop right hemicolectomy 08/14/2014 for a large colon cancer at the hepatic flexure, now with leukocytosis.  EXAM: CT ABDOMEN AND PELVIS WITHOUT CONTRAST  TECHNIQUE: Multidetector CT imaging of the abdomen and pelvis was performed following the standard protocol without IV contrast. Oral contrast was administered.  COMPARISON:  Preoperative CT abdomen and pelvis 08/11/2014.  FINDINGS: Anastomosis between the ileum and transverse colon is identified and appears intact. Marked wall thickening involving a several cm segment of the mid to distal ileum, mild wall thickening involving loops of distal jejunum or proximal ileum in the pelvis, though the distal ileum  at the anastomosis is uninvolved. The remaining small bowel is unremarkable. No evidence of bowel obstruction. Extensive diverticulosis involving the distal descending and sigmoid colon without evidence of acute diverticulitis. Small amount of ascites in the pelvis and in the perihepatic region. No abnormal fluid collection to suggest abscess. No free intraperitoneal air. Large hiatal hernia again noted with at least half of the stomach present in the chest.  Large splenic infarct again noted. No new abnormalities elsewhere involving the abdomen or pelvis.  Large bilateral pleural effusions, left greater than right, with associated passive atelectasis in the lower lobes. Linear atelectasis in the lingula.  IMPRESSION: 1. No evidence of abscess.  No evidence of free intraperitoneal air. 2. Small amount of ascites. 3. Marked wall thickening involving a several cm segment of the mid to distal ileum and mild wall thickening involving loops of distal jejunum or proximal ileum in the pelvis. This was not present on the preoperative examination and likely indicates enteritis. 4. Large splenic infarct, unchanged. 5. Large hiatal hernia, unchanged. 6. Large bilateral pleural effusions, left greater than right with passive atelectasis in the lower lobes. Linear atelectasis in the lingula.   Electronically Signed   By: Evangeline Dakin M.D.   On: 08/22/2014 16:22    Anti-infectives: Anti-infectives    Start     Dose/Rate Route Frequency Ordered Stop   08/23/14 1200  levofloxacin (LEVAQUIN) tablet 500 mg     500 mg Oral Every 48 hours 08/21/14 1114  08/21/14 1200  levofloxacin (LEVAQUIN) tablet 750 mg     750 mg Oral Once 08/21/14 1114 08/21/14 1246   08/18/14 1800  vancomycin (VANCOCIN) IVPB 750 mg/150 ml premix  Status:  Discontinued     750 mg 150 mL/hr over 60 Minutes Intravenous Every 24 hours 08/18/14 1547 08/21/14 1049   08/18/14 1700  ceFEPIme (MAXIPIME) 1 g in dextrose 5 % 50 mL IVPB  Status:   Discontinued     1 g 100 mL/hr over 30 Minutes Intravenous Every 24 hours 08/18/14 1547 08/21/14 1049   08/14/14 0830  [MAR Hold]  cefoTEtan (CEFOTAN) 2 g in dextrose 5 % 50 mL IVPB     (MAR Hold since 08/14/14 0818)  Comments:  Pharmacy may adjust dose strength for optimal dosing.   Send with patient on call to the OR.  Anesthesia to complete antibiotic administration <87min prior to incision per [facility].   2 g 100 mL/hr over 30 Minutes Intravenous On call to O.R. 08/14/14 0756 08/14/14 0930      Assessment/Plan: s/p Procedure(s): RIGHT HEMI COLECTOMY (Right) Continue abx for enteritis and pneumonia  Plan for rehab when she is ready to leave hospital PT  LOS: 11 days    Cheryl Larson,PAUL S 08/23/2014

## 2014-08-23 NOTE — Progress Notes (Signed)
Patient ID: Cheryl Larson, female   DOB: November 27, 1915, 81 y.o.   MRN: [id number]  [facility] PROGRESS NOTE  Cheryl Larson QMG:[id number] DOB: 11/23/1915 DOA: 08/11/2014 PCP: Claretta Fraise, MD   Brief narrative:    81 year old female with a history of atrial fibrillation (not on anticoagulation), hypothyroidism admitted for evaluation of diarrhea. CT abdomen showed a large mass at the hepatic flexure. General surgery was consulted, cardiology was also consulted for preoperative clearance. 2-D echocardiogram revealed normal ejection fraction. Patient underwent right hemicolectomy on 08/14/14. Unfortunately postoperative course as been complicated by ileus that has now started to resolve, and also by development of hospital-acquired pneumonia. Plans are to discharge to SNF when medically ready.  Assessment/Plan:    Principal Problem:   Colonic mass, biopsy positive for adenocarcinoma  - status post right hemicolectomy, post op day #9, pt is clinically stable this AM but still volume overloaded - IVF have been stopped 3/16 and pt currently on soft diet tolerating well  - concern is still persistent leukocytosis, CT abd with no abscess but confirms enteritis, WBC is trending down overall  - will continue to follow up  Active Problems:   LE swelling - from volume overload - weight at pt's baseline is 116 lbs (on admission) - weight trending down since initiation of Lasix: 142 lbs --> 140 lbs - continue Lasix 40 mg IV QD and monitor daily weights, I's and O's   Enteritis - noted on repeat CT abd and pelvis - should be self resolving - OK to continue with current diet    Post op ileus - slowly resolving with supportive care - advance diet to soft, so far tolerating well    Hypokalemia - supplemented and WNL this AM   Acute hypoxic respiratory failure - secondary to HCAP and new vascular congestion from 3/16 - CXR consistent with vascular congestion and again CT abd notable for  large pleural effusions  - continue Levaquin until 3/21 - continue Lasix as well as noted above    Essential hypertension - restarted home metoprolol XL 50 mg daily    Hypothyroidism - continue synthroid    Anemia of chronic disease, colon ca - overall Hg 9 - 10 - no sings of active bleeding - CBC in AM History of atrial fibrillation - Rate controlled with metoprolol - previously on coumadin. CHADS2VASC score of 4 - no longer on AC given frail condition, risk of fall  Large splenic infarct - seen on CT scan. Not a candidate for long-term anticoagulation - asymptomatic without any abdominal pain   Severe PCM - in the context of acute on chronic illness and progressive failure to thrive - tolerating soft diet well   DVT prophylaxis: Heparin SQ  Code Status: Full.  Family Communication:  plan of care discussed with daughter over the phone  Disposition Plan: SNF when volume overload resolved   IV access:  Peripheral IV  Procedures and diagnostic studies:    CXR 08/20/2014 Findings felt to represent a degree of underlying congestive heart failure. Atelectasis in each lung base. No frank airspace consolidation. Paraesophageal type hernia, better seen on recent CT.    Dg Abd 1 View   08/04/2014  No abnormality to correspond with the patient's history.     Ct Abdomen Pelvis W Contrast  08/11/2014    Large mass at hepatic flexure of colon measuring 9.6 x 6.3 x 6.3 cm in size consistent with a colonic neoplasm, causing colonic and small bowel obstruction.  Minimal peritumoral  infiltration could be related to edema/ascites though tumor extension is not excluded.  Minimal ascites.  Atrophic pancreas.  Additionally, large splenic infarct with nonvisualization of the splenic vein and note of scattered collaterals in the perisplenic region.   CXR  08/18/2014  Bilateral lower lobe airspace opacities and effusions, right greater than left. This could represent asymmetric edema or infection. Given  the patient's history of leukocytosis, pneumonia is favored.     Dg Abd Portable 1v  08/17/2014   Suspected mild postoperative ileus.     Ct Abdomen Pelvis Wo Contrast  08/22/2014  No evidence of abscess.  No evidence of free intraperitoneal air. 2. Small amount of ascites. 3. Marked wall thickening involving a several cm segment of the mid to distal ileum and mild wall thickening involving loops of distal jejunum or proximal ileum in the pelvis. This was not present on the preoperative examination and likely indicates enteritis. 4. Large splenic infarct, unchanged. 5. Large hiatal hernia, unchanged. 6. Large bilateral pleural effusions, left greater than right with passive atelectasis in the lower lobes. Linear atelectasis in the lingula.     Medical Consultants:  Surgery   Other Consultants:  PT  IAnti-Infectives:   Vancomycin 3/14 --> 3/17 Cefepime 3/14 -->  3/17 Levaquin 3/17 -->  Faye Ramsay, MD  [facility] Pager [phone]  If 7PM-7AM, please contact night-coverage [email or web address] Password [facility] 08/23/2014, 11:36 AM   LOS: 11 days   HPI/Subjective: No events overnight.   Objective: Filed Vitals:   08/22/14 1717 08/22/14 2100 08/23/14 0525 08/23/14 0958  BP:  132/88 138/89 128/78  Pulse:  72 79 68  Temp:  98 F (36.7 C) 98 F (36.7 C)   TempSrc:  Oral Oral   Resp:  20 19 16   Height:      Weight: 58.469 kg (128 lb 14.4 oz)  63.8 kg (140 lb 10.5 oz)   SpO2:  93% 94%     Intake/Output Summary (Last 24 hours) at 08/23/14 1136 Last data filed at 08/23/14 1100  Gross per 24 hour  Intake    603 ml  Output    451 ml  Net    152 ml    Exam:   General:  Pt is alert, follows commands appropriately, not in acute distress  Cardiovascular: Regular rate and rhythm, no rubs, no gallops  Respiratory: Clear to auscultation bilaterally, no wheezing, bibasilar crackles still present   Abdomen: Soft, non tender, non distended, no guarding  Extremities: +1 bilateral LE  pitting edema, pulses DP and PT palpable bilaterally  Neuro: Grossly nonfocal  Data Reviewed: Basic Metabolic Panel:  Recent Labs Lab 08/18/14 0536 08/20/14 0614 08/21/14 0630 08/22/14 0433 08/23/14 0435  NA 141 138 137 135 133*  K 3.5 3.0* 3.1* 3.0* 4.1  CL 115* 112 108 101 102  CO2 19 20 20 24 26   GLUCOSE 140* 105* 106* 121* 96  BUN 11 7 7 7 6   CREATININE 0.80 0.77 0.88 0.94 0.88  CALCIUM 7.7* 8.1* 8.1* 8.4 8.1*   CBC:  Recent Labs Lab 08/19/14 0440 08/20/14 0614 08/21/14 0630 08/22/14 0433 08/23/14 0435  WBC 13.4* 16.1* 11.7* 15.7* 11.9*  HGB 9.0* 8.6* 8.3* 9.7* 10.1*  HCT 29.9* 27.2* 26.4* 31.1* 32.3*  MCV 83.8 82.4 82.5 81.8 83.0  PLT 288 255 257 312 224     Recent Results (from the past 240 hour(s))  Clostridium Difficile by PCR     Status: None   Collection Time: 08/13/14  6:11 PM  Result  Value Ref Range Status   C difficile by pcr NEGATIVE NEGATIVE Final  Surgical pcr screen     Status: None   Collection Time: 08/13/14  7:19 PM  Result Value Ref Range Status   MRSA, PCR NEGATIVE NEGATIVE Final   Staphylococcus aureus NEGATIVE NEGATIVE Final  Culture, blood (routine x 2)     Status: None (Preliminary result)   Collection Time: 08/18/14  6:12 PM  Result Value Ref Range Status   Specimen Description BLOOD LEFT ARM  Final   Report Status PENDING  Incomplete  Culture, blood (routine x 2)     Status: None (Preliminary result)   Collection Time: 08/18/14  6:20 PM  Result Value Ref Range Status   Specimen Description BLOOD RIGHT ARM  Final   Culture   Final   Report Status PENDING  Incomplete     Scheduled Meds: . famotidine  20 mg Oral Daily  . feeding supplement (RESOURCE BREEZE)  1 Container Oral TID BM  . furosemide  40 mg Intravenous q morning - 10a  . heparin subcutaneous  5,000 Units Subcutaneous 3 times per day  . levofloxacin  500 mg Oral Q48H  . levothyroxine  88 mcg Oral QAC breakfast  . metoprolol succinate  50 mg Oral Daily  . sodium  chloride  3 mL Intravenous Q12H    Continuous Infusions:

## 2014-08-24 LAB — BASIC METABOLIC PANEL
Anion gap: 11 (ref 5–15)
BUN: 10 mg/dL (ref 6–23)
CALCIUM: 8.5 mg/dL (ref 8.4–10.5)
CO2: 25 mmol/L (ref 19–32)
Chloride: 98 mmol/L (ref 96–112)
Creatinine, Ser: 0.9 mg/dL (ref 0.50–1.10)
GFR calc Af Amer: 61 mL/min — ABNORMAL LOW (ref >90)
GFR calc non Af Amer: 52 mL/min — ABNORMAL LOW (ref >90)
Glucose, Bld: 130 mg/dL — ABNORMAL HIGH (ref 70–99)
Potassium: 3.7 mmol/L (ref 3.5–5.1)
Sodium: 134 mmol/L — ABNORMAL LOW (ref 135–145)

## 2014-08-24 LAB — CBC
HCT: 25.7 % — ABNORMAL LOW (ref 36.0–46.0)
Hemoglobin: 8.1 g/dL — ABNORMAL LOW (ref 12.0–15.0)
MCH: 26.6 pg (ref 26.0–34.0)
MCHC: 31.5 g/dL (ref 30.0–36.0)
MCV: 84.3 fL (ref 78.0–100.0)
PLATELETS: 377 10*3/uL (ref 150–400)
RBC: 3.05 MIL/uL — ABNORMAL LOW (ref 3.87–5.11)
RDW: 22.6 % — ABNORMAL HIGH (ref 11.5–15.5)
WBC: 11.4 10*3/uL — ABNORMAL HIGH (ref 4.0–10.5)

## 2014-08-24 NOTE — Progress Notes (Signed)
Patient ID: Cheryl Larson, female   DOB: Aug 29, 1915, 81 y.o.   MRN: [id number]  [facility] PROGRESS NOTE  Cheryl Larson MAY:[id number] DOB: [birth year] DOA: 08/11/2014 PCP: Claretta Fraise, MD   Brief narrative:    81 year old female with a history of atrial fibrillation (not on anticoagulation), hypothyroidism admitted for evaluation of diarrhea. CT abdomen showed a large mass at the hepatic flexure. General surgery was consulted, cardiology was also consulted for preoperative clearance. 2-D echocardiogram revealed normal ejection fraction. Patient underwent right hemicolectomy on 08/14/14. Unfortunately postoperative course as been complicated by ileus that has now started to resolve, and also by development of hospital-acquired pneumonia. Plans are to discharge to SNF when medically ready.  Assessment/Plan:    Principal Problem:   Colonic mass, biopsy positive for adenocarcinoma  - status post right hemicolectomy, post op day #10, pt is clinically stable this AM but still volume overloaded - IVF have been stopped 3/16 and pt currently on soft diet tolerating well  - concern is still persistent leukocytosis, CT abd with no abscess but confirms enteritis, WBC is trending down overall  - will continue to follow Active Problems:   LE swelling - from volume overload - weight at pt's baseline is 116 lbs (on admission) - weight trending down since initiation of Lasix: 142 lbs --> 140 lbs --> 137 lbs  - continue Lasix 40 mg IV QD and monitor daily weights, I's and O's   Enteritis - noted on repeat CT abd and pelvis - should be self resolving - OK to continue with current diet    Post op ileus - slowly resolving with supportive care - advance diet to soft, so far tolerating well    Hypokalemia - supplemented and WNL this AM   Acute hypoxic respiratory failure - secondary to HCAP and new vascular congestion from 3/16 - CXR consistent with vascular congestion and again CT abd  notable for large pleural effusions  - continue Levaquin until 3/21 - continue Lasix as well as noted above    Essential hypertension - restarted home metoprolol XL 50 mg daily    Hypothyroidism - continue synthroid    Anemia of chronic disease, colon ca - drop in Hg overnight, repeat CBC in AM - no sings of active bleeding History of atrial fibrillation - Rate controlled with metoprolol - previously on coumadin. CHADS2VASC score of 4 - no longer on AC given frail condition, risk of fall  Large splenic infarct - seen on CT scan. Not a candidate for long-term anticoagulation - asymptomatic without any abdominal pain   Severe PCM - in the context of acute on chronic illness and progressive failure to thrive - tolerating soft diet well   DVT prophylaxis: Heparin SQ  Code Status: Full.  Family Communication:  plan of care discussed with daughter over the phone  Disposition Plan: SNF when volume overload resolved   IV access:  Peripheral IV  Procedures and diagnostic studies:    CXR 08/20/2014 Findings felt to represent a degree of underlying congestive heart failure. Atelectasis in each lung base. No frank airspace consolidation. Paraesophageal type hernia, better seen on recent CT.    Dg Abd 1 View   08/04/2014  No abnormality to correspond with the patient's history.     Ct Abdomen Pelvis W Contrast  08/11/2014    Large mass at hepatic flexure of colon measuring 9.6 x 6.3 x 6.3 cm in size consistent with a colonic neoplasm, causing colonic and small bowel obstruction.  Minimal  peritumoral infiltration could be related to edema/ascites though tumor extension is not excluded.  Minimal ascites.  Atrophic pancreas.  Additionally, large splenic infarct with nonvisualization of the splenic vein and note of scattered collaterals in the perisplenic region.   CXR  08/18/2014  Bilateral lower lobe airspace opacities and effusions, right greater than left. This could represent asymmetric edema  or infection. Given the patient's history of leukocytosis, pneumonia is favored.     Dg Abd Portable 1v  08/17/2014   Suspected mild postoperative ileus.     Ct Abdomen Pelvis Wo Contrast  08/22/2014  No evidence of abscess.  No evidence of free intraperitoneal air. 2. Small amount of ascites. 3. Marked wall thickening involving a several cm segment of the mid to distal ileum and mild wall thickening involving loops of distal jejunum or proximal ileum in the pelvis. This was not present on the preoperative examination and likely indicates enteritis. 4. Large splenic infarct, unchanged. 5. Large hiatal hernia, unchanged. 6. Large bilateral pleural effusions, left greater than right with passive atelectasis in the lower lobes. Linear atelectasis in the lingula.     Medical Consultants:  Surgery   Other Consultants:  PT  IAnti-Infectives:   Vancomycin 3/14 --> 3/17 Cefepime 3/14 -->  3/17 Levaquin 3/17 -->  Faye Ramsay, MD  [facility] Pager [phone]  If 7PM-7AM, please contact night-coverage [email or web address] Password [facility] 08/24/2014, 11:46 AM   LOS: 12 days   HPI/Subjective: No events overnight.   Objective: Filed Vitals:   08/23/14 1520 08/23/14 2123 08/24/14 0612 08/24/14 0928  BP: 120/58 135/58 133/67 136/73  Pulse: 64 74 71 66  Temp: 97.6 F (36.4 C) 98.5 F (36.9 C) 98.4 F (36.9 C)   TempSrc: Oral Oral Oral   Resp: 14 16 16    Height:      Weight:   62.2 kg (137 lb 2 oz)   SpO2: 94% 96% 95%     Intake/Output Summary (Last 24 hours) at 08/24/14 1146 Last data filed at 08/24/14 0958  Gross per 24 hour  Intake    123 ml  Output      0 ml  Net    123 ml    Exam:   General:  Pt is alert, follows commands appropriately, not in acute distress  Cardiovascular: Regular rate and rhythm, no rubs, no gallops  Respiratory: Clear to auscultation bilaterally, no wheezing, bibasilar crackles still present but better compared to yesterday   Abdomen: Soft, non tender, non  distended, no guarding  Extremities: +1 bilateral LE pitting edema, pulses DP and PT palpable bilaterally  Neuro: Grossly nonfocal  Data Reviewed: Basic Metabolic Panel:  Recent Labs Lab 08/20/14 0614 08/21/14 0630 08/22/14 0433 08/23/14 0435 08/24/14 0418  NA 138 137 135 133* 134*  K 3.0* 3.1* 3.0* 4.1 3.7  CL 112 108 101 102 98  CO2 20 20 24 26 25   GLUCOSE 105* 106* 121* 96 130*  BUN 7 7 7 6 10   CREATININE 0.77 0.88 0.94 0.88 0.90  CALCIUM 8.1* 8.1* 8.4 8.1* 8.5   CBC:  Recent Labs Lab 08/20/14 0614 08/21/14 0630 08/22/14 0433 08/23/14 0435 08/24/14 0418  WBC 16.1* 11.7* 15.7* 11.9* 11.4*  HGB 8.6* 8.3* 9.7* 10.1* 8.1*  HCT 27.2* 26.4* 31.1* 32.3* 25.7*  MCV 82.4 82.5 81.8 83.0 84.3  PLT 255 257 312 224 377     Recent Results (from the past 240 hour(s))  Clostridium Difficile by PCR     Status: None   Collection Time:  08/13/14  6:11 PM  Result Value Ref Range Status   C difficile by pcr NEGATIVE NEGATIVE Final  Surgical pcr screen     Status: None   Collection Time: 08/13/14  7:19 PM  Result Value Ref Range Status   MRSA, PCR NEGATIVE NEGATIVE Final   Staphylococcus aureus NEGATIVE NEGATIVE Final  Culture, blood (routine x 2)     Status: None (Preliminary result)   Collection Time: 08/18/14  6:12 PM  Result Value Ref Range Status   Specimen Description BLOOD LEFT ARM  Final   Report Status PENDING  Incomplete  Culture, blood (routine x 2)     Status: None (Preliminary result)   Collection Time: 08/18/14  6:20 PM  Result Value Ref Range Status   Specimen Description BLOOD RIGHT ARM  Final   Culture   Final   Report Status PENDING  Incomplete     Scheduled Meds: . famotidine  20 mg Oral Daily  . feeding supplement (RESOURCE BREEZE)  1 Container Oral TID BM  . furosemide  40 mg Intravenous q morning - 10a  . heparin subcutaneous  5,000 Units Subcutaneous 3 times per day  . levofloxacin  500 mg Oral Q48H  . levothyroxine  88 mcg Oral QAC breakfast   . metoprolol succinate  50 mg Oral Daily  . sodium chloride  3 mL Intravenous Q12H    Continuous Infusions:

## 2014-08-24 NOTE — Progress Notes (Signed)
Physical Therapy Treatment Patient Details Name: Cheryl Larson MRN: [id number] DOB: December 10, 1915 Today's Date: 08/24/2014    History of Present Illness Pt admit with colon mass.  Hemicolectomy performed.     PT Comments    Making progress with functional mobility and activity tolerance, with less need for seated rest breaks and able to increase amb distance;   Overall progressing well; Anticipate continuing good progress at post-acute rehabilitation.   Follow Up Recommendations  SNF     Equipment Recommendations  3in1 (PT)    Recommendations for Other Services       Precautions / Restrictions Precautions Precautions: Fall    Mobility  Bed Mobility Overal bed mobility: Needs Assistance Bed Mobility: Supine to Sit     Supine to sit: Min guard     General bed mobility comments: HOB elevated; Pt with inefficient movement, but not needing physical assist  Transfers Overall transfer level: Needs assistance Equipment used: Rolling walker (2 wheeled) Transfers: Sit to/from Stand Sit to Stand: Min assist         General transfer comment: verbal cues for hand placement  Ambulation/Gait Ambulation/Gait assistance: Min guard Ambulation Distance (Feet): 30 Feet (x2) Assistive device: Rolling walker (2 wheeled) Gait Pattern/deviations: Step-through pattern;Trunk flexed;Decreased stride length Gait velocity: Slow   General Gait Details: Pt with flexed posture.  Pt takes small steps and needed cues for RW safety. Patient required 1 seated rest break; fatigued at end of walk, but improving   Stairs            Wheelchair Mobility    Modified Rankin (Stroke Patients Only)       Balance             Standing balance-Leahy Scale: Poor                      Cognition Arousal/Alertness: Awake/alert Behavior During Therapy: WFL for tasks assessed/performed Overall Cognitive Status: Within Functional Limits for tasks assessed                      Exercises      General Comments General comments (skin integrity, edema, etc.): Daughter present and asking about pt's medications; paged MD      Pertinent Vitals/Pain Pain Assessment: No/denies pain    Home Living                      Prior Function            PT Goals (current goals can now be found in the care plan section) Acute Rehab PT Goals Patient Stated Goal: to feel better  PT Goal Formulation: With patient Time For Goal Achievement: 08/22/14 Potential to Achieve Goals: Good Progress towards PT goals: Progressing toward goals    Frequency  Min 3X/week    PT Plan Current plan remains appropriate    Co-evaluation             End of Session   Activity Tolerance: Patient tolerated treatment well Patient left: in chair;with call bell/phone within reach;with family/visitor present     Time: 8127-5170 PT Time Calculation (min) (ACUTE ONLY): 24 min  Charges:  $Gait Training: 8-22 mins $Therapeutic Activity: 8-22 mins                    G Codes:      Quin Hoop 08/24/2014, 5:07 PM  Roney Marion, [place] Pager [phone] Office  [phone]  

## 2014-08-24 NOTE — Progress Notes (Signed)
10 Days Post-Op  Subjective: No complaints  Objective: Vital signs in last 24 hours: Temp:  [97.6 F (36.4 C)-98.5 F (36.9 C)] 98.4 F (36.9 C) (03/20 0612) Pulse Rate:  [64-74] 71 (03/20 0612) Resp:  [14-16] 16 (03/20 0612) BP: (120-135)/(58-78) 133/67 mmHg (03/20 0612) SpO2:  [94 %-96 %] 95 % (03/20 0612) Weight:  [62.2 kg (137 lb 2 oz)] 62.2 kg (137 lb 2 oz) (03/20 0612) Last BM Date: 08/24/14  Intake/Output from previous day: 03/19 0701 - 03/20 0700 In: 126 [P.O.:120; I.V.:6] Out: 150 [Urine:150] Intake/Output this shift:    Resp: clear to auscultation bilaterally Cardio: regular rate and rhythm GI: soft, minimal tenderness. incision looks good  Lab Results:   Recent Labs  08/23/14 0435 08/24/14 0418  WBC 11.9* 11.4*  HGB 10.1* 8.1*  HCT 32.3* 25.7*  PLT 224 377   BMET  Recent Labs  08/23/14 0435 08/24/14 0418  NA 133* 134*  K 4.1 3.7  CL 102 98  CO2 26 25  GLUCOSE 96 130*  BUN 6 10  CREATININE 0.88 0.90  CALCIUM 8.1* 8.5   PT/INR No results for input(s): LABPROT, INR in the last 72 hours. ABG No results for input(s): PHART, HCO3 in the last 72 hours.  Invalid input(s): PCO2, PO2  Studies/Results: Ct Abdomen Pelvis Wo Contrast  08/22/2014   CLINICAL DATA:  Postop right hemicolectomy 08/14/2014 for a large colon cancer at the hepatic flexure, now with leukocytosis.  EXAM: CT ABDOMEN AND PELVIS WITHOUT CONTRAST  TECHNIQUE: Multidetector CT imaging of the abdomen and pelvis was performed following the standard protocol without IV contrast. Oral contrast was administered.  COMPARISON:  Preoperative CT abdomen and pelvis 08/11/2014.  FINDINGS: Anastomosis between the ileum and transverse colon is identified and appears intact. Marked wall thickening involving a several cm segment of the mid to distal ileum, mild wall thickening involving loops of distal jejunum or proximal ileum in the pelvis, though the distal ileum at the anastomosis is uninvolved. The  remaining small bowel is unremarkable. No evidence of bowel obstruction. Extensive diverticulosis involving the distal descending and sigmoid colon without evidence of acute diverticulitis. Small amount of ascites in the pelvis and in the perihepatic region. No abnormal fluid collection to suggest abscess. No free intraperitoneal air. Large hiatal hernia again noted with at least half of the stomach present in the chest.  Large splenic infarct again noted. No new abnormalities elsewhere involving the abdomen or pelvis.  Large bilateral pleural effusions, left greater than right, with associated passive atelectasis in the lower lobes. Linear atelectasis in the lingula.  IMPRESSION: 1. No evidence of abscess.  No evidence of free intraperitoneal air. 2. Small amount of ascites. 3. Marked wall thickening involving a several cm segment of the mid to distal ileum and mild wall thickening involving loops of distal jejunum or proximal ileum in the pelvis. This was not present on the preoperative examination and likely indicates enteritis. 4. Large splenic infarct, unchanged. 5. Large hiatal hernia, unchanged. 6. Large bilateral pleural effusions, left greater than right with passive atelectasis in the lower lobes. Linear atelectasis in the lingula.   Electronically Signed   By: Evangeline Dakin M.D.   On: 08/22/2014 16:22    Anti-infectives: Anti-infectives    Start     Dose/Rate Route Frequency Ordered Stop   08/23/14 1200  levofloxacin (LEVAQUIN) tablet 500 mg     500 mg Oral Every 48 hours 08/21/14 1114     08/21/14 1200  levofloxacin (LEVAQUIN)  tablet 750 mg     750 mg Oral Once 08/21/14 1114 08/21/14 1246   08/18/14 1800  vancomycin (VANCOCIN) IVPB 750 mg/150 ml premix  Status:  Discontinued     750 mg 150 mL/hr over 60 Minutes Intravenous Every 24 hours 08/18/14 1547 08/21/14 1049   08/18/14 1700  ceFEPIme (MAXIPIME) 1 g in dextrose 5 % 50 mL IVPB  Status:  Discontinued     1 g 100 mL/hr over 30  Minutes Intravenous Every 24 hours 08/18/14 1547 08/21/14 1049   08/14/14 0830  [MAR Hold]  cefoTEtan (CEFOTAN) 2 g in dextrose 5 % 50 mL IVPB     (MAR Hold since 08/14/14 0818)  Comments:  Pharmacy may adjust dose strength for optimal dosing.   Send with patient on call to the OR.  Anesthesia to complete antibiotic administration <57min prior to incision per [facility].   2 g 100 mL/hr over 30 Minutes Intravenous On call to O.R. 08/14/14 0756 08/14/14 0930      Assessment/Plan: s/p Procedure(s): RIGHT HEMI COLECTOMY (Right) Advance diet  Will d/c staples before discharge Waiting for rehab placement Continue abx for enteritis and pneumonia  LOS: 12 days    TOTH III,PAUL S 08/24/2014

## 2014-08-25 LAB — CBC
HEMATOCRIT: 27.9 % — AB (ref 36.0–46.0)
Hemoglobin: 8.7 g/dL — ABNORMAL LOW (ref 12.0–15.0)
MCH: 26.2 pg (ref 26.0–34.0)
MCHC: 31.2 g/dL (ref 30.0–36.0)
MCV: 84 fL (ref 78.0–100.0)
Platelets: 358 10*3/uL (ref 150–400)
RBC: 3.32 MIL/uL — ABNORMAL LOW (ref 3.87–5.11)
RDW: 22.6 % — AB (ref 11.5–15.5)
WBC: 8.7 10*3/uL (ref 4.0–10.5)

## 2014-08-25 LAB — BASIC METABOLIC PANEL
Anion gap: 12 (ref 5–15)
BUN: 11 mg/dL (ref 6–23)
CHLORIDE: 94 mmol/L — AB (ref 96–112)
CO2: 29 mmol/L (ref 19–32)
Calcium: 8.9 mg/dL (ref 8.4–10.5)
Creatinine, Ser: 0.91 mg/dL (ref 0.50–1.10)
GFR calc Af Amer: 60 mL/min — ABNORMAL LOW (ref >90)
GFR calc non Af Amer: 52 mL/min — ABNORMAL LOW (ref >90)
GLUCOSE: 104 mg/dL — AB (ref 70–99)
POTASSIUM: 3.2 mmol/L — AB (ref 3.5–5.1)
SODIUM: 135 mmol/L (ref 135–145)

## 2014-08-25 LAB — CULTURE, BLOOD (ROUTINE X 2)
Culture: NO GROWTH
Culture: NO GROWTH

## 2014-08-25 MED ORDER — POTASSIUM CHLORIDE CRYS ER 20 MEQ PO TBCR
40.0000 meq | EXTENDED_RELEASE_TABLET | Freq: Once | ORAL | Status: AC
  Administered 2014-08-25: 40 meq via ORAL
  Filled 2014-08-25: qty 2

## 2014-08-25 MED ORDER — HYDROCODONE-ACETAMINOPHEN 5-325 MG PO TABS: 1.0000 | ORAL_TABLET | ORAL | Status: DC | PRN

## 2014-08-25 MED ORDER — TRAMADOL HCL 50 MG PO TABS: 50.0000 mg | ORAL_TABLET | Freq: Four times a day (QID) | ORAL | Status: DC | PRN

## 2014-08-25 MED ORDER — FAMOTIDINE 20 MG PO TABS: 20.0000 mg | ORAL_TABLET | Freq: Every day | ORAL | Status: DC

## 2014-08-25 MED ORDER — LEVOFLOXACIN 500 MG PO TABS: 500.0000 mg | ORAL_TABLET | ORAL | Status: DC

## 2014-08-25 NOTE — Clinical Social Work Note (Addendum)
CSW spoke to patient and daughter who are in agreement to going to SNF for short term rehab.  Patient will be discharging to [facility] this afternoon.  Patient and family are pleased with idea of discharge, and were grateful for the help.  Patient will be transported via [facility], nurse to call report.  Jones Broom. [place], MSW, [place] 08/25/2014 11:53 AM

## 2014-08-25 NOTE — Progress Notes (Signed)
Cheryl Larson to be D/C'd Skilled nursing facility: [facility] per MD order.  Discussed with the patient and all questions fully answered.  VSS. Staples removed from abdominal incision and replaced with benzoin and steri-strips per MD order. Stage 2 wound noted to sacrum.  IV catheter discontinued intact. Site without signs and symptoms of complications. Dressing and pressure applied.  Social Work prepared discharge packet and this was sent with EMS on transfer.  Packet includes prescriptions.    Patient escorted via stretcher and transported via EMS to [facility].  Report given to RN at [facility].  Daughter notified of patient's transfer to [facility].  Micki Riley 08/25/2014 11:46 AM

## 2014-08-25 NOTE — Discharge Instructions (Signed)
[facility]      [facility] °[phone] ° °OPEN ABDOMINAL SURGERY: POST OP INSTRUCTIONS ° °Always review your discharge instruction sheet given to you by the facility where your surgery was performed. ° °IF YOU HAVE DISABILITY OR FAMILY LEAVE FORMS, YOU MUST BRING THEM TO THE OFFICE FOR PROCESSING.  PLEASE DO NOT GIVE THEM TO YOUR DOCTOR. ° °1. A prescription for pain medication may be given to you upon discharge.  Take your pain medication as prescribed, if needed.  If narcotic pain medicine is not needed, then you may take acetaminophen (Tylenol) or ibuprofen (Advil) as needed. °2. Take your usually prescribed medications unless otherwise directed. °3. If you need a refill on your pain medication, please contact your pharmacy. They will contact our office to request authorization.  Prescriptions will not be filled after 5pm or on week-ends. °4. You should follow a light diet the first few days after arrival home, such as soup and crackers, pudding, etc.unless your doctor has advised otherwise. A high-fiber, low fat diet can be resumed as tolerated.   Be sure to include lots of fluids daily. Most patients will experience some swelling and bruising on the chest and neck area.  Ice packs will help.  Swelling and bruising can take several days to resolve °5. Most patients will experience some swelling and bruising in the area of the incision. Ice pack will help. Swelling and bruising can take several days to resolve..  °6. It is common to experience some constipation if taking pain medication after surgery.  Increasing fluid intake and taking a stool softener will usually help or prevent this problem from occurring.  A mild laxative (Milk of Magnesia or Miralax) should be taken according to package directions if there are no bowel movements after 48 hours. °7.  You may have steri-strips (small skin tapes) in place directly over the incision.  These strips should be left on the skin for 7-10 days.  If your  surgeon used skin glue on the incision, you may shower in 24 hours.  The glue will flake off over the next 2-3 weeks.  Any sutures or staples will be removed at the office during your follow-up visit. You may find that a light gauze bandage over your incision may keep your staples from being rubbed or pulled. You may shower and replace the bandage daily. °8. ACTIVITIES:  You may resume regular (light) daily activities beginning the next day--such as daily self-care, walking, climbing stairs--gradually increasing activities as tolerated.  You may have sexual intercourse when it is comfortable.  Refrain from any heavy lifting or straining until approved by your doctor. °a. You may drive when you no longer are taking prescription pain medication, you can comfortably wear a seatbelt, and you can safely maneuver your car and apply brakes °b. Return to Work: ___________________________________ °9. You should see your doctor in the office for a follow-up appointment approximately two weeks after your surgery.  Make sure that you call for this appointment within a day or two after you arrive home to insure a convenient appointment time. °OTHER INSTRUCTIONS:  °_____________________________________________________________ °_____________________________________________________________ ° °WHEN TO CALL YOUR DOCTOR: °1. Fever over 101.0 °2. Inability to urinate °3. Nausea and/or vomiting °4. Extreme swelling or bruising °5. Continued bleeding from incision. °6. Increased pain, redness, or drainage from the incision. °7. Difficulty swallowing or breathing °8. Muscle cramping or spasms. °9. Numbness or tingling in hands or feet or around lips. ° °The clinic staff is available to   answer your questions during regular business hours.  Please dont hesitate to call and ask to speak to one of the nurses if you have concerns.  For further questions, please visit [email or web address]     Colorectal Cancer Colorectal  cancer is an abnormal growth of tissue (tumor) in the colon or rectum that is cancerous (malignant). Unlike noncancerous (benign) tumors, malignant tumors can spread to other parts of your body. The colon is the large bowel or large intestine. The rectum is the last several inches of the colon.  RISK FACTORS The exact cause of colorectal cancer is unknown. However, the following factors may increase your chances of getting colorectal cancer:   Age older than 61 years.   Abnormal growths (polyps) on the inner wall of the colon or rectum.   Diabetes.   African American race.   Family history of hereditary nonpolyposis colorectal cancer. This condition is caused by changes in the genes that are responsible for repairing mismatched DNA.   Personal history of cancer. A person who has already had colorectal cancer may develop it a second time. Also, women with a history of ovarian, uterine, or breast cancer are at a somewhat higher risk of developing colorectal cancer.  Certain hereditary conditions.  Eating a diet that is high in fat (especially animal fat) and low in fiber, fruits, and vegetables.  Sedentary lifestyle.  Inflammatory bowel disease, including ulcerative colitis and Crohn's disease.   Smoking.   Excessive alcohol use.  SYMPTOMS Early colorectal cancer often does not cause symptoms. As the cancer grows, symptoms may include:   Changes in bowel habits.  Diarrhea.   Constipation.   Feeling like the bowel does not empty completely after a bowel movement.   Blood in the stool.   Stools that are narrower than usual.   Abdominal discomfort, pain, bloating, fullness, or cramps.  Frequent gas pain.   Unexplained weight loss.   Constant tiredness.   Nausea and vomiting.  DIAGNOSIS  Your health care provider will ask about your medical history. He or she may also perform a number of procedures, such as:   A physical exam.  A digital rectal  exam.  A fecal occult blood test.  A barium enema.  Blood tests.   X-rays.   Imaging tests, such as CT scans or MRIs.   Taking a tissue sample (biopsy) from your colon or rectum to look for cancer cells.   A sigmoidoscopy to view the inside of the last part of your colon.   A colonoscopy to view the inside of your entire colon.   An endorectal ultrasound to see how deep a rectal tumor has grown and whether the cancer has spread to lymph nodes or other nearby tissues.  Your cancer will be staged to determine its severity and extent. Staging is a careful attempt to find out the size of the tumor, whether the cancer has spread, and if so, to what parts of the body. You may need to have more tests to determine the stage of your cancer. The test results will help determine what treatment plan is best for you.   Stage 0. The cancer is found only in the innermost lining of the colon or rectum.   Stage I. The cancer has grown into the inner wall of the colon or rectum. The cancer has not yet reached the outer wall of the colon.   Stage II. The cancer extends more deeply into or through the wall of the colon or  rectum. It may have invaded nearby tissue, but cancer cells have not spread to the lymph nodes.   Stage III. The cancer has spread to nearby lymph nodes but not to other parts of the body.   Stage IV. The cancer has spread to other parts of the body, such as the liver or lungs.  Your health care provider may tell you the detailed stage of your cancer, which includes both a number and a letter.  TREATMENT  Depending on the type and stage, colorectal cancer may be treated with surgery, radiation therapy, chemotherapy, targeted therapy, or radiofrequency ablation. Some people have a combination of these therapies. Surgery may be done to remove the polyps from your colon. In early stages, your health care provider may be able to do this during a colonoscopy. In later stages,  surgery may be done to remove part of your colon.  HOME CARE INSTRUCTIONS   Take medicines only as directed by your health care provider.   Maintain a healthy diet.   Consider joining a support group. This may help you learn to cope with the stress of having colorectal cancer.   Seek advice to help you manage treatment of side effects.   Keep all follow-up visits as directed by your health care provider.   Inform your cancer specialist if you are admitted to the hospital.  SEEK MEDICAL CARE IF:  Your diarrhea or constipation does not go away.   Your bowel habits change.  You have increased abdominal pain.   You notice new fatigue or weakness.  You lose weight. Document Released: 05/23/2005 Document Revised: 10/07/2013 Document Reviewed: 11/15/2012 [facility] Patient Information 2015 [place], [place]. This information is not intended to replace advice given to you by your health care provider. Make sure you discuss any questions you have with your health care provider.

## 2014-08-25 NOTE — Discharge Summary (Signed)
Physician Discharge Summary  Cheryl Larson JME:[id number] DOB: 01/10/1916 DOA: 08/11/2014  PCP: Claretta Fraise, MD  Admit date: 08/11/2014 Discharge date: 08/25/2014  Recommendations for Outpatient Follow-up:  1. Pt will need to follow up with PCP in 2-3 weeks post discharge 2. Please obtain BMP to evaluate electrolytes and kidney function 3. Please also check CBC to evaluate Hg and Hct levels 4. Pt needs to follow up with Dr. Jonette Eva and daughter [name] made aware 5. Also pt needs to continue Levaquin for two more doses, next dose due on 3/23 and last dose due 3/25. Please note Levaquin to be given every other day.  Discharge Diagnoses:  Principal Problem:   Colonic mass Active Problems:   Anemia, iron deficiency   Essential hypertension   Hypothyroidism   Chronic atrial fibrillation   Partial small bowel obstruction   Abdominal mass, RUQ (right upper quadrant)   Discharge Condition: Stable  Diet recommendation: Heart healthy diet discussed in details   History of present illness:    Brief narrative:    81 year old female with a history of atrial fibrillation (not on anticoagulation), hypothyroidism admitted for evaluation of diarrhea. CT abdomen showed a large mass at the hepatic flexure. General surgery was consulted, cardiology was also consulted for preoperative clearance. 2-D echocardiogram revealed normal ejection fraction. Patient underwent right hemicolectomy on 08/14/14. Unfortunately postoperative course as been complicated by ileus that has now started to resolve, and also by development of hospital-acquired pneumonia. Plans are to discharge to SNF when medically ready.  Assessment/Plan:    Principal Problem:  Colonic mass, biopsy positive for adenocarcinoma  - status post right hemicolectomy, post op day #1, pt is clinically stable this AM  - weight is back at baseline 117 lbs - IVF have been stopped 3/16 and pt currently on soft diet tolerating well  - CT  abd with no new abscess, WBC is now WNL   Active Problems:  LE swelling - LE with no swelling - weight at pt's baseline is 116 lbs (on admission) - weight trending down since initiation of Lasix: 142 lbs --> 140 lbs --> 137 lbs --> 117 lbs - no need for additional lasix upon discharge   Enteritis - noted on repeat CT abd and pelvis - should be self resolving - OK to continue with current diet   Post op ileus - slowly resolving with supportive care - advance diet to soft, so far tolerating well   Hypokalemia - supplemented prior to discharge   Acute hypoxic respiratory failure - secondary to HCAP and new vascular congestion from 3/16 - CXR consistent with vascular congestion and again CT abd notable for large pleural effusions  - continue Levaquin for two more dose as noted above  - now resolved, no need for additional Lasix   Essential hypertension - restarted home metoprolol XL 50 mg daily   Hypothyroidism - continue synthroid   Anemia of chronic disease, colon ca - no sings of active bleeding History of atrial fibrillation - Rate controlled with metoprolol - previously on coumadin. CHADS2VASC score of 4 - no longer on AC given frail condition, risk of fall  Large splenic infarct - seen on CT scan. Not a candidate for long-term anticoagulation - asymptomatic without any abdominal pain  Severe PCM - in the context of acute on chronic illness and progressive failure to thrive - tolerating soft diet well    Code Status: Full.  Family Communication: plan of care discussed with daughter over the phone  Disposition Plan:  SNF   IV access:  Peripheral IV  Procedures and diagnostic studies:   CXR 08/20/2014 Findings felt to represent a degree of underlying congestive heart failure. Atelectasis in each lung base. No frank airspace consolidation. Paraesophageal type hernia, better seen on recent CT.   Dg Abd 1 View 08/04/2014 No abnormality to  correspond with the patient's history.   Ct Abdomen Pelvis W Contrast 08/11/2014 Large mass at hepatic flexure of colon measuring 9.6 x 6.3 x 6.3 cm in size consistent with a colonic neoplasm, causing colonic and small bowel obstruction. Minimal peritumoral infiltration could be related to edema/ascites though tumor extension is not excluded. Minimal ascites. Atrophic pancreas. Additionally, large splenic infarct with nonvisualization of the splenic vein and note of scattered collaterals in the perisplenic region.   CXR 08/18/2014 Bilateral lower lobe airspace opacities and effusions, right greater than left. This could represent asymmetric edema or infection. Given the patient's history of leukocytosis, pneumonia is favored.   Dg Abd Portable 1v 08/17/2014 Suspected mild postoperative ileus.   Ct Abdomen Pelvis Wo Contrast 08/22/2014 No evidence of abscess. No evidence of free intraperitoneal air. 2. Small amount of ascites. 3. Marked wall thickening involving a several cm segment of the mid to distal ileum and mild wall thickening involving loops of distal jejunum or proximal ileum in the pelvis. This was not present on the preoperative examination and likely indicates enteritis. 4. Large splenic infarct, unchanged. 5. Large hiatal hernia, unchanged. 6. Large bilateral pleural effusions, left greater than right with passive atelectasis in the lower lobes. Linear atelectasis in the lingula.   Medical Consultants:  Surgery   Other Consultants:  PT  IAnti-Infectives:   Vancomycin 3/14 --> 3/17 Cefepime 3/14 --> 3/17 Levaquin 3/17 --> two more dose, 3.23 and 3/25      Discharge Exam: Filed Vitals:   08/25/14 1013  BP:   Pulse: 75  Temp:   Resp:    Filed Vitals:   08/24/14 1504 08/24/14 2101 08/25/14 0518 08/25/14 1013  BP:  124/102 123/70   Pulse:  77 65 75  Temp:  97.9 F (36.6 C) 98 F (36.7 C)   TempSrc:  Oral Oral   Resp:  16 15   Height:       Weight: 51.075 kg (112 lb 9.6 oz)  53.2 kg (117 lb 4.6 oz)   SpO2:  99% 95%     General: Pt is alert, follows commands appropriately, not in acute distress Cardiovascular: Regular rate and rhythm, no rubs, no gallops Respiratory: Clear to auscultation bilaterally, no wheezing, no crackles, no rhonchi Abdominal: Soft, non tender, non distended, bowel sounds +, no guarding Extremities: no edema, no cyanosis, pulses palpable bilaterally DP and PT Neuro: Grossly nonfocal  Discharge Instructions  Discharge Instructions    Diet - low sodium heart healthy    Complete by:  As directed      Increase activity slowly    Complete by:  As directed             Medication List    STOP taking these medications        ciprofloxacin 250 MG tablet  Commonly known as:  CIPRO     megestrol 400 MG/10ML suspension  Commonly known as:  MEGACE      TAKE these medications        Cholecalciferol 2000 UNITS Tabs  Take 1 tablet by mouth every morning.     famotidine 20 MG tablet  Commonly known as:  PEPCID  Take 1 tablet (20 mg total) by mouth daily.     HYDROcodone-acetaminophen 5-325 MG per tablet  Commonly known as:  NORCO/VICODIN  Take 1-2 tablets by mouth every 4 (four) hours as needed for moderate pain or severe pain.     levofloxacin 500 MG tablet  Commonly known as:  LEVAQUIN  Take 1 tablet (500 mg total) by mouth every other day.     levothyroxine 88 MCG tablet  Commonly known as:  SYNTHROID, LEVOTHROID  Take 88 mcg by mouth daily before breakfast.     metoprolol succinate 50 MG 24 hr tablet  Commonly known as:  TOPROL-XL  Take 50 mg by mouth daily. Take with or immediately following a meal.     primidone 250 MG tablet  Commonly known as:  MYSOLINE  Take 125 mg by mouth at bedtime.     traMADol 50 MG tablet  Commonly known as:  ULTRAM  Take 1 tablet (50 mg total) by mouth every 6 (six) hours as needed.           Follow-up Information    Follow up with  Claretta Fraise, MD.   Specialty:  Family Medicine   Contact information:   [address] [place] [address] [phone]        The results of significant diagnostics from this hospitalization (including imaging, microbiology, ancillary and laboratory) are listed below for reference.     Microbiology: Recent Results (from the past 240 hour(s))  Culture, blood (routine x 2)     Status: None   Collection Time: 08/18/14  6:12 PM  Result Value Ref Range Status   Specimen Description BLOOD LEFT ARM  Final   Special Requests BOTTLES DRAWN AEROBIC AND ANAEROBIC 5CC  Final   Culture   Final    NO GROWTH 5 DAYS Performed at [facility]    Report Status 08/25/2014 FINAL  Final  Culture, blood (routine x 2)     Status: None   Collection Time: 08/18/14  6:20 PM  Result Value Ref Range Status   Specimen Description BLOOD RIGHT ARM  Final   Special Requests BOTTLES DRAWN AEROBIC AND ANAEROBIC 5CC  Final   Culture   Final    NO GROWTH 5 DAYS Performed at [facility]    Report Status 08/25/2014 FINAL  Final     Labs: Basic Metabolic Panel:  Recent Labs Lab 08/21/14 0630 08/22/14 0433 08/23/14 0435 08/24/14 0418 08/25/14 0530  NA 137 135 133* 134* 135  K 3.1* 3.0* 4.1 3.7 3.2*  CL 108 101 102 98 94*  CO2 20 24 26 25 29   GLUCOSE 106* 121* 96 130* 104*  BUN 7 7 6 10 11   CREATININE 0.88 0.94 0.88 0.90 0.91  CALCIUM 8.1* 8.4 8.1* 8.5 8.9   Liver Function Tests: No results for input(s): AST, ALT, ALKPHOS, BILITOT, PROT, ALBUMIN in the last 168 hours. No results for input(s): LIPASE, AMYLASE in the last 168 hours. No results for input(s): AMMONIA in the last 168 hours. CBC:  Recent Labs Lab 08/21/14 0630 08/22/14 0433 08/23/14 0435 08/24/14 0418 08/25/14 0530  WBC 11.7* 15.7* 11.9* 11.4* 8.7  HGB 8.3* 9.7* 10.1* 8.1* 8.7*  HCT 26.4* 31.1* 32.3* 25.7* 27.9*  MCV 82.5 81.8 83.0 84.3 84.0  PLT 257 312 224 377 358   Cardiac Enzymes: No results for  input(s): CKTOTAL, CKMB, CKMBINDEX, TROPONINI in the last 168 hours. BNP: BNP (last 3 results) No results for input(s): BNP in the  last 8760 hours.  ProBNP (last 3 results) No results for input(s): PROBNP in the last 8760 hours.  CBG: No results for input(s): GLUCAP in the last 168 hours.   SIGNED: Time coordinating discharge: Over 30 minutes  Faye Ramsay, MD  [facility] 08/25/2014, 10:44 AM Pager [phone]  If 7PM-7AM, please contact night-coverage [email or web address] Password [email or web address]

## 2014-08-25 NOTE — Progress Notes (Signed)
[facility] Progress Note  11 Days Post-Op  Subjective: Pt seems tired or frustrated today, but alert.  Answers my questions appropriately.  Denies any complaints or concerns.  Says breathing is better.  No N/V, tolerating diet.  Having flatus and BM's.  Mobilizing OOB with PT/OT.    Objective: Vital signs in last 24 hours: Temp:  [97.9 F (36.6 C)-98.1 F (36.7 C)] 98 F (36.7 C) (03/21 0518) Pulse Rate:  [65-77] 65 (03/21 0518) Resp:  [15-16] 15 (03/21 0518) BP: (123-145)/(70-102) 123/70 mmHg (03/21 0518) SpO2:  [94 %-99 %] 95 % (03/21 0518) Weight:  [112 lb 9.6 oz (51.075 kg)-117 lb 4.6 oz (53.2 kg)] 117 lb 4.6 oz (53.2 kg) (03/21 0518) Last BM Date: 08/24/14  Intake/Output from previous day: 03/20 0701 - 03/21 0700 In: 240 [P.O.:240] Out: 200 [Urine:200] Intake/Output this shift:    PE: Gen:  Alert, NAD, pleasant Card:  RRR, no M/G/R heard Pulm:  CTA, no W/R/R Abd: Soft, NT/ND, +BS, no HSM, incisions C/D/I with staples in place   Lab Results:   Recent Labs  08/24/14 0418 08/25/14 0530  WBC 11.4* 8.7  HGB 8.1* 8.7*  HCT 25.7* 27.9*  PLT 377 358   BMET  Recent Labs  08/24/14 0418 08/25/14 0530  NA 134* 135  K 3.7 3.2*  CL 98 94*  CO2 25 29  GLUCOSE 130* 104*  BUN 10 11  CREATININE 0.90 0.91  CALCIUM 8.5 8.9   PT/INR No results for input(s): LABPROT, INR in the last 72 hours. CMP     Component Value Date/Time   NA 135 08/25/2014 0530   NA 139 06/20/2014 1416   K 3.2* 08/25/2014 0530   CL 94* 08/25/2014 0530   CO2 29 08/25/2014 0530   GLUCOSE 104* 08/25/2014 0530   GLUCOSE 121* 06/20/2014 1416   BUN 11 08/25/2014 0530   BUN 26 06/20/2014 1416   CREATININE 0.91 08/25/2014 0530   CALCIUM 8.9 08/25/2014 0530   PROT 7.6 08/11/2014 2130   PROT 7.2 06/20/2014 1416   ALBUMIN 2.7* 08/11/2014 2130   AST 19 08/11/2014 2130   ALT 11 08/11/2014 2130   ALKPHOS 90 08/11/2014 2130   BILITOT 0.4 08/11/2014 2130   GFRNONAA 52* 08/25/2014  0530   GFRAA 60* 08/25/2014 0530   Lipase     Component Value Date/Time   LIPASE 12 08/11/2014 2130       Studies/Results: No results found.  Anti-infectives: Anti-infectives    Start     Dose/Rate Route Frequency Ordered Stop   08/23/14 1200  levofloxacin (LEVAQUIN) tablet 500 mg     500 mg Oral Every 48 hours 08/21/14 1114     08/21/14 1200  levofloxacin (LEVAQUIN) tablet 750 mg     750 mg Oral Once 08/21/14 1114 08/21/14 1246   08/18/14 1800  vancomycin (VANCOCIN) IVPB 750 mg/150 ml premix  Status:  Discontinued     750 mg 150 mL/hr over 60 Minutes Intravenous Every 24 hours 08/18/14 1547 08/21/14 1049   08/18/14 1700  ceFEPIme (MAXIPIME) 1 g in dextrose 5 % 50 mL IVPB  Status:  Discontinued     1 g 100 mL/hr over 30 Minutes Intravenous Every 24 hours 08/18/14 1547 08/21/14 1049   08/14/14 0830  [MAR Hold]  cefoTEtan (CEFOTAN) 2 g in dextrose 5 % 50 mL IVPB     (MAR Hold since 08/14/14 0818)  Comments:  Pharmacy may adjust dose strength for optimal dosing.   Send with patient  on call to the OR.  Anesthesia to complete antibiotic administration <103min prior to incision per [facility].   2 g 100 mL/hr over 30 Minutes Intravenous On call to O.R. 08/14/14 0756 08/14/14 0930       Assessment/Plan Large right colon mass with partial stricture POD #11 right hemicolectomy ---Dr. 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 Repeat CT done on 08/22/14 shows no intraabdominal abscess.  WBC normal and afebrile -Mobilize & IS, PT/OT -SCD/heparin -D/C staples prior to discharge -Dr. Georgette Dover discussed results with the patient. Dr. Marin Olp follows. No further treatment given age -Further management per primary team Enteritis noted on 08/22/14 CT scan Pneumonia -abx per primary team Large stable splenic infarct -not a candidate for long-term anticoag per primary  team -asymptomatic Disp -SNF at discharge when medicine feels she's stable    LOS: 13 days    DORT, Colt Martelle 08/25/2014, 7:58 AM Pager: [phone]

## 2014-08-27 ENCOUNTER — Encounter (HOSPITAL_COMMUNITY): Payer: Self-pay

## 2014-09-12 ENCOUNTER — Ambulatory Visit: Payer: Self-pay | Admitting: Pharmacist

## 2014-09-12 ENCOUNTER — Telehealth: Payer: Self-pay | Admitting: Pharmacist

## 2014-09-12 DIAGNOSIS — I482 Chronic atrial fibrillation, unspecified: Secondary | ICD-10-CM

## 2014-09-12 NOTE — Telephone Encounter (Signed)
Spoke with patient's daughter.  Patient is currently as [facility] for rehab after surgery.  She is anticipating that patient will be home in next week and then home health will be coming out.  Cheryl Larson is not longer taking warfarin - stopped in hospital.

## 2014-09-16 ENCOUNTER — Telehealth: Payer: Self-pay | Admitting: Family Medicine

## 2014-09-16 NOTE — Telephone Encounter (Signed)
Pt given hospital follow up appt with Dr.Stacks 4/19 at 3:10.

## 2014-09-17 ENCOUNTER — Telehealth: Payer: Self-pay | Admitting: Family Medicine

## 2014-09-19 NOTE — Telephone Encounter (Signed)
Orders given per Dr. Livia Snellen

## 2014-09-22 ENCOUNTER — Telehealth: Payer: Self-pay | Admitting: Family Medicine

## 2014-09-23 ENCOUNTER — Encounter: Payer: Self-pay | Admitting: Family Medicine

## 2014-09-23 ENCOUNTER — Ambulatory Visit (INDEPENDENT_AMBULATORY_CARE_PROVIDER_SITE_OTHER): Payer: Medicare Other | Admitting: Family Medicine

## 2014-09-23 VITALS — BP 129/77 | HR 77 | Temp 98.8°F | Ht 61.0 in | Wt 118.0 lb

## 2014-09-23 DIAGNOSIS — M545 Low back pain, unspecified: Secondary | ICD-10-CM

## 2014-09-23 DIAGNOSIS — R350 Frequency of micturition: Secondary | ICD-10-CM

## 2014-09-23 DIAGNOSIS — H6502 Acute serous otitis media, left ear: Secondary | ICD-10-CM | POA: Diagnosis not present

## 2014-09-23 DIAGNOSIS — C19 Malignant neoplasm of rectosigmoid junction: Secondary | ICD-10-CM | POA: Diagnosis not present

## 2014-09-23 DIAGNOSIS — N390 Urinary tract infection, site not specified: Secondary | ICD-10-CM | POA: Diagnosis not present

## 2014-09-23 LAB — POCT CBC
Granulocyte percent: 63.7 %G (ref 37–80)
HCT, POC: 34.3 % — AB (ref 37.7–47.9)
Hemoglobin: 10.4 g/dL — AB (ref 12.2–16.2)
Lymph, poc: 1.6 (ref 0.6–3.4)
MCH, POC: 27.2 pg (ref 27–31.2)
MCHC: 30.5 g/dL — AB (ref 31.8–35.4)
MCV: 89.5 fL (ref 80–97)
MPV: 7.7 fL (ref 0–99.8)
POC GRANULOCYTE: 3.8 (ref 2–6.9)
POC LYMPH %: 26.9 % (ref 10–50)
Platelet Count, POC: 323 10*3/uL (ref 142–424)
RBC: 3.83 M/uL — AB (ref 4.04–5.48)
RDW, POC: 24.4 %
WBC: 6 10*3/uL (ref 4.6–10.2)

## 2014-09-23 LAB — POCT URINALYSIS DIPSTICK
Bilirubin, UA: NEGATIVE
GLUCOSE UA: NEGATIVE
Ketones, UA: NEGATIVE
Nitrite, UA: POSITIVE
UROBILINOGEN UA: NEGATIVE
pH, UA: 5

## 2014-09-23 LAB — POCT UA - MICROSCOPIC ONLY
Casts, Ur, LPF, POC: NEGATIVE
Crystals, Ur, HPF, POC: NEGATIVE
Mucus, UA: NEGATIVE
Yeast, UA: NEGATIVE

## 2014-09-23 MED ORDER — LEVOFLOXACIN 500 MG PO TABS: 500.0000 mg | ORAL_TABLET | Freq: Every day | ORAL | Status: DC

## 2014-09-23 MED ORDER — CIPROFLOXACIN HCL 250 MG PO TABS: 250.0000 mg | ORAL_TABLET | Freq: Every day | ORAL | Status: AC

## 2014-09-23 MED ORDER — MIRTAZAPINE 7.5 MG PO TABS: 7.5000 mg | ORAL_TABLET | Freq: Every day | ORAL | Status: AC

## 2014-09-23 MED ORDER — HYDROCODONE-ACETAMINOPHEN 5-325 MG PO TABS: 1.0000 | ORAL_TABLET | ORAL | Status: DC | PRN

## 2014-09-23 MED ORDER — CIPROFLOXACIN HCL 250 MG PO TABS: 250.0000 mg | ORAL_TABLET | Freq: Two times a day (BID) | ORAL | Status: DC

## 2014-09-23 MED ORDER — LEVOTHYROXINE SODIUM 100 MCG PO TABS: 100.0000 ug | ORAL_TABLET | Freq: Every day | ORAL | Status: DC

## 2014-09-23 MED ORDER — METOPROLOL SUCCINATE ER 50 MG PO TB24: 50.0000 mg | ORAL_TABLET | Freq: Every day | ORAL | Status: AC

## 2014-09-23 NOTE — Progress Notes (Signed)
Subjective:  Patient ID: Cheryl Larson, female    DOB: Dec 16, 1915  Age: 81 y.o. MRN: [id number]  CC: Hospitalization Follow-up and Urinary Frequency   HPI [name] presents for refill of hydrocodone. She has had 20 years of use of hydrocodone for back and knee pain. Additionally she recently had surgery for obstructive colon cancer. This was done for palliative purposes the tumor was debulked but there is remaining tumor in the abdomen and due to advanced age she was not attached offered chemotherapy or radiation. She understands that her condition is terminal and will eventually lead to her demise. She is also having frequency of urination and has a history of repeated UTIs. She was on trimethoprim as a preventative in the past but it seems to become less effective. It was discontinued when she went into the hospital. She has pain in the left ear with some popping but no diminished hearing is noted. Pain is in the midline back but does not radiate. It is moderate in severity. She has knee pain that is moderate as well it is exacerbated by ambulation. And also stiffens up overnight.  History Kaylea has a past medical history of Thyroid disease; Hypertension; A-fib; and Blood transfusion without reported diagnosis.   She has past surgical history that includes Cholecystectomy and Colostomy revision (Right, 08/14/2014).   Her family history is negative for Colon cancer.She reports that she has never smoked. She has never used smokeless tobacco. She reports that she does not drink alcohol or use illicit drugs.  Current Outpatient Prescriptions on File Prior to Visit  Medication Sig Dispense Refill  . Cholecalciferol 2000 UNITS TABS Take 1 tablet by mouth every morning.    . primidone (MYSOLINE) 250 MG tablet Take 125 mg by mouth at bedtime.      No current facility-administered medications on file prior to visit.    ROS Review of Systems  Constitutional: Negative for fever, chills,  diaphoresis, appetite change, fatigue and unexpected weight change.  HENT: Negative for congestion, ear pain, hearing loss, postnasal drip, rhinorrhea, sneezing, sore throat and trouble swallowing.   Eyes: Negative for pain.  Respiratory: Negative for cough, chest tightness and shortness of breath.   Cardiovascular: Negative for chest pain and palpitations.  Gastrointestinal: Negative for nausea, vomiting, abdominal pain, diarrhea and constipation.  Genitourinary: Negative for dysuria, frequency and menstrual problem.  Musculoskeletal: Negative for joint swelling and arthralgias.  Skin: Negative for rash.  Neurological: Negative for dizziness, weakness, numbness and headaches.  Psychiatric/Behavioral: Negative for dysphoric mood and agitation.    Objective:  BP 129/77 mmHg  Pulse 77  Temp(Src) 98.8 F (37.1 C) (Oral)  Ht 5\' 1"  (1.549 m)  Wt 118 lb (53.524 kg)  BMI 22.31 kg/m2  BP Readings from Last 3 Encounters:  09/23/14 129/77  08/25/14 123/70  08/04/14 107/74    Wt Readings from Last 3 Encounters:  09/23/14 118 lb (53.524 kg)  08/25/14 117 lb 4.6 oz (53.2 kg)  08/04/14 116 lb (52.617 kg)     Physical Exam  Constitutional: She is oriented to person, place, and time. She appears well-developed and well-nourished. No distress.  HENT:  Head: Normocephalic and atraumatic.  Right Ear: External ear normal.  Left Ear: External ear normal.  Nose: Nose normal.  Mouth/Throat: Oropharynx is clear and moist.  Eyes: Conjunctivae and EOM are normal. Pupils are equal, round, and reactive to light.  Neck: Normal range of motion. Neck supple. No thyromegaly present.  Cardiovascular: Normal rate, regular  rhythm and normal heart sounds.   No murmur heard. Pulmonary/Chest: Effort normal and breath sounds normal. No respiratory distress. She has no wheezes. She has no rales.  Abdominal: Soft. Bowel sounds are normal. She exhibits no distension. There is no tenderness.  Lymphadenopathy:      She has no cervical adenopathy.  Neurological: She is alert and oriented to person, place, and time. She has normal reflexes.  Skin: Skin is warm and dry.  Psychiatric: She has a normal mood and affect. Her behavior is normal. Judgment and thought content normal.    No results found for: HGBA1C  Lab Results  Component Value Date   WBC 6.0 09/23/2014   HGB 10.4* 09/23/2014   HCT 34.3* 09/23/2014   PLT 358 08/25/2014   GLUCOSE 104* 08/25/2014   ALT 11 08/11/2014   AST 19 08/11/2014   NA 135 08/25/2014   K 3.2* 08/25/2014   CL 94* 08/25/2014   CREATININE 0.91 08/25/2014   BUN 11 08/25/2014   CO2 29 08/25/2014   TSH 3.632 08/14/2014   INR 2.6 07/18/2014    Ct Abdomen Pelvis W Contrast  08/11/2014   CLINICAL DATA:  RIGHT lower quadrant abdominal pain, feels like she has a mass in that area, 60 pound weight loss over 3 years, occult GI bleeding, hypertension, atrial fibrillation  EXAM: CT ABDOMEN AND PELVIS WITH CONTRAST  TECHNIQUE: Multidetector CT imaging of the abdomen and pelvis was performed using the standard protocol following bolus administration of intravenous contrast. Sagittal and coronal MPR images reconstructed from axial data set.  CONTRAST:  158mL OMNIPAQUE IOHEXOL 300 MG/ML SOLN IV. Dilute oral contrast.  COMPARISON:  None  FINDINGS: Small bibasilar pleural effusions and atelectasis.  Large area of absent enhancement within the spleen consistent with a large splenic infarct.  No splenic vein is identified though perisplenic collaterals are seen.  Small splenule adjacent to splenic hilum.  Atrophic pancreas with CBD dilatation and prior cholecystectomy.  Liver, kidneys, and adrenal glands normal.  Large mass at hepatic flexure of colon measuring 9.6 x 6.3 x 6.3 cm in size consistent with a colonic neoplasm.  Mild infiltrative changes of the fat surrounding the mass are seen which could be related to edema, ascites, or tumor extension.  Mass obstructs the RIGHT colon, with  dilatation of the ascending colon and cecum as well as of multiple small bowel loops.  Transverse colon decompressed.  Diverticulosis of descending and sigmoid colon without evidence of diverticulitis.  Moderate sized hiatal hernia.  Remainder of stomach unremarkable.  Atherosclerotic changes at aorta and coronary arteries.  No additional mass, adenopathy, or free air.  Minimal perihepatic and pelvic ascites.  Bones demineralized with scattered degenerative disc and facet disease changes of the thoracolumbar spine.  IMPRESSION: Large mass at hepatic flexure of colon measuring 9.6 x 6.3 x 6.3 cm in size consistent with a colonic neoplasm, causing colonic and small bowel obstruction.  Minimal peritumoral infiltration could be related to edema/ascites though tumor extension is not excluded.  Minimal ascites.  Atrophic pancreas.  Additionally, large splenic infarct with nonvisualization of the splenic vein and note of scattered collaterals in the perisplenic region.  Findings called to [employer] on 08/11/2014 at 1613 hours.  Findings also discussed with patient and her daughter.   Electronically Signed   By: Lavonia Dana M.D.   On: 08/11/2014 16:37   Dg Chest Port 1 View  08/11/2014   CLINICAL DATA:  Low abdominal pain since October 2015, recent diagnosis of  abdominal tumor. Loss of appetite, intermittent diarrhea and constipation. Weakness.  EXAM: PORTABLE CHEST - 1 VIEW  COMPARISON:  None.  FINDINGS: The cardiac silhouette appears mild to moderately enlarged. Mediastinal silhouette is nonsuspicious. Large hiatal hernia. Bold bibasilar strandy densities, mildly elevated LEFT hemidiaphragm. Skin folds project in LEFT chest. No pleural effusion or focal consolidation. Gaseous distended esophagus. Soft tissue planes and included osseous structures are nonsuspicious.  IMPRESSION: Mild to moderate cardiomegaly.  Bibasilar atelectasis.  Large hiatal hernia.   Electronically Signed   By: Elon Alas   On:  08/11/2014 23:17    Assessment & Plan:   Terrilyn was seen today for hospitalization follow-up and urinary frequency.  Diagnoses and all orders for this visit:  Urinary frequency Orders: -     POCT UA - Microscopic Only -     POCT urinalysis dipstick -     Urine culture -     POCT CBC  UTI (urinary tract infection), uncomplicated  Acute serous otitis media of left ear, recurrence not specified  Colorectal cancer, stage IV  Midline low back pain without sciatica  Other orders -     levofloxacin (LEVAQUIN) 500 MG tablet; Take 1 tablet (500 mg total) by mouth daily. -     Discontinue: ciprofloxacin (CIPRO) 250 MG tablet; Take 1 tablet (250 mg total) by mouth 2 (two) times daily. -     ciprofloxacin (CIPRO) 250 MG tablet; Take 1 tablet (250 mg total) by mouth daily. -     HYDROcodone-acetaminophen (NORCO/VICODIN) 5-325 MG per tablet; Take 1-2 tablets by mouth every 4 (four) hours as needed for moderate pain or severe pain. -     levothyroxine (SYNTHROID, LEVOTHROID) 100 MCG tablet; Take 1 tablet (100 mcg total) by mouth daily. -     mirtazapine (REMERON) 7.5 MG tablet; Take 1 tablet (7.5 mg total) by mouth at bedtime. -     metoprolol succinate (TOPROL-XL) 50 MG 24 hr tablet; Take 1 tablet (50 mg total) by mouth daily. Take with or immediately following a meal.   I have discontinued Ms. Quesenberry's levothyroxine, famotidine, and levofloxacin. I have also changed her ciprofloxacin, levothyroxine, mirtazapine, and metoprolol succinate. Additionally, I am having her start on levofloxacin. Lastly, I am having her maintain her primidone, Cholecalciferol, SANTYL, megestrol, and HYDROcodone-acetaminophen.     Follow-up: Return in about 3 months (around 12/23/2014).  Claretta Fraise, M.D.

## 2014-09-25 LAB — URINE CULTURE

## 2014-10-03 ENCOUNTER — Telehealth: Payer: Self-pay | Admitting: Family Medicine

## 2014-10-04 ENCOUNTER — Ambulatory Visit (INDEPENDENT_AMBULATORY_CARE_PROVIDER_SITE_OTHER): Payer: Medicare Other | Admitting: Family Medicine

## 2014-10-04 DIAGNOSIS — Z483 Aftercare following surgery for neoplasm: Secondary | ICD-10-CM

## 2014-10-04 DIAGNOSIS — M6281 Muscle weakness (generalized): Secondary | ICD-10-CM

## 2014-10-04 DIAGNOSIS — D509 Iron deficiency anemia, unspecified: Secondary | ICD-10-CM | POA: Diagnosis not present

## 2014-10-04 DIAGNOSIS — I482 Chronic atrial fibrillation: Secondary | ICD-10-CM | POA: Diagnosis not present

## 2014-10-04 DIAGNOSIS — J189 Pneumonia, unspecified organism: Secondary | ICD-10-CM | POA: Diagnosis not present

## 2014-10-04 DIAGNOSIS — C182 Malignant neoplasm of ascending colon: Secondary | ICD-10-CM | POA: Diagnosis not present

## 2014-10-06 NOTE — Telephone Encounter (Signed)
Patient family member aware

## 2014-10-06 NOTE — Telephone Encounter (Signed)
If the blood pressure is staying over 173 systolic several days in a row will need to increase her medicine. Have her check it regularly and let me know. However if it is just 1  reading then just observe for now.

## 2014-11-10 ENCOUNTER — Encounter: Payer: Self-pay | Admitting: Family Medicine

## 2014-11-10 ENCOUNTER — Ambulatory Visit (INDEPENDENT_AMBULATORY_CARE_PROVIDER_SITE_OTHER): Payer: Medicare Other | Admitting: Family Medicine

## 2014-11-10 VITALS — BP 135/88 | HR 68 | Temp 98.0°F | Ht 61.0 in | Wt 113.0 lb

## 2014-11-10 DIAGNOSIS — C189 Malignant neoplasm of colon, unspecified: Secondary | ICD-10-CM | POA: Diagnosis not present

## 2014-11-10 DIAGNOSIS — R319 Hematuria, unspecified: Secondary | ICD-10-CM

## 2014-11-10 DIAGNOSIS — R197 Diarrhea, unspecified: Secondary | ICD-10-CM

## 2014-11-10 DIAGNOSIS — N390 Urinary tract infection, site not specified: Secondary | ICD-10-CM

## 2014-11-10 MED ORDER — HYDROCODONE-ACETAMINOPHEN 5-325 MG PO TABS: 1.0000 | ORAL_TABLET | ORAL | Status: DC | PRN

## 2014-11-10 NOTE — Patient Instructions (Signed)
Use immodium only for moderately severe diarrhea. Use only the minimum to slow down the stool. Use 1/2 dose no more than once every 6 hours. Anything more will put you at risk for constipation.

## 2014-11-10 NOTE — Progress Notes (Signed)
Subjective:  Patient ID: Cheryl Larson, female    DOB: 07/27/1915  Age: 81 y.o. MRN: [id number]  CC: Urinary Tract Infection and Pain   HPI Cheryl Larson presents for right lower quadrant pain after recent surgery for resection of large obstructive colon mass. Appetite is fair to good. She is recovering well. However she does have intermittent bouts of diarrhea. She is also having some dysuria for the last 3-4 days.   Cheryl Larson has a past medical history of Thyroid disease; Hypertension; A-fib; and Blood transfusion without reported diagnosis.   She has past surgical history that includes Cholecystectomy and Colostomy revision (Right, 08/14/2014).   Her family history is negative for Colon cancer.She reports that she has never smoked. She has never used smokeless tobacco. She reports that she does not drink alcohol or use illicit drugs.  Outpatient Prescriptions Prior to Visit  Medication Sig Dispense Refill  . Cholecalciferol 2000 UNITS TABS Take 1 tablet by mouth every morning.    . ciprofloxacin (CIPRO) 250 MG tablet Take 1 tablet (250 mg total) by mouth daily. 30 tablet 5  . levothyroxine (SYNTHROID, LEVOTHROID) 100 MCG tablet Take 1 tablet (100 mcg total) by mouth daily. 90 tablet 1  . megestrol (MEGACE) 40 MG/ML suspension     . metoprolol succinate (TOPROL-XL) 50 MG 24 hr tablet Take 1 tablet (50 mg total) by mouth daily. Take with or immediately following a meal. 90 tablet 1  . mirtazapine (REMERON) 7.5 MG tablet Take 1 tablet (7.5 mg total) by mouth at bedtime. 90 tablet 1  . primidone (MYSOLINE) 250 MG tablet Take 125 mg by mouth at bedtime.     Marland Kitchen SANTYL ointment     . HYDROcodone-acetaminophen (NORCO/VICODIN) 5-325 MG per tablet Take 1-2 tablets by mouth every 4 (four) hours as needed for moderate pain or severe pain. 30 tablet 0  . levofloxacin (LEVAQUIN) 500 MG tablet Take 1 tablet (500 mg total) by mouth daily. (Patient not taking: Reported on 11/10/2014) 10 tablet 0   No  facility-administered medications prior to visit.    ROS Review of Systems  Constitutional: Negative for fever, chills and diaphoresis.  HENT: Negative for congestion.   Eyes: Negative for visual disturbance.  Respiratory: Negative for cough and shortness of breath.   Cardiovascular: Negative for chest pain and palpitations.  Gastrointestinal: Negative for nausea, diarrhea and constipation.  Genitourinary: Positive for dysuria, urgency and frequency. Negative for hematuria, flank pain, decreased urine volume, menstrual problem and pelvic pain.  Musculoskeletal: Negative for joint swelling and arthralgias.  Skin: Negative for rash.  Neurological: Negative for dizziness and numbness.    Objective:  BP 135/88 mmHg  Pulse 68  Temp(Src) 98 F (36.7 C) (Oral)  Ht 5\' 1"  (1.549 m)  Wt 113 lb (51.256 kg)  BMI 21.36 kg/m2  BP Readings from Last 3 Encounters:  11/10/14 135/88  09/23/14 129/77  08/25/14 123/70    Wt Readings from Last 3 Encounters:  11/10/14 113 lb (51.256 kg)  09/23/14 118 lb (53.524 kg)  08/25/14 117 lb 4.6 oz (53.2 kg)     Physical Exam  Constitutional: She is oriented to person, place, and time. She appears well-developed and well-nourished. No distress.  HENT:  Head: Normocephalic and atraumatic.  Right Ear: External ear normal.  Left Ear: External ear normal.  Nose: Nose normal.  Mouth/Throat: Oropharynx is clear and moist.  Eyes: Conjunctivae and EOM are normal. Pupils are equal, round, and reactive to light.  Neck: Normal range  of motion. Neck supple. No thyromegaly present.  Cardiovascular: Normal rate, regular rhythm and normal heart sounds.   No murmur heard. Pulmonary/Chest: Effort normal and breath sounds normal. No respiratory distress. She has no wheezes. She has no rales.  Abdominal: Soft. Bowel sounds are normal. She exhibits no distension and no mass. There is tenderness (minimal at right lower quadrant). There is no rebound and no guarding.    Lymphadenopathy:    She has no cervical adenopathy.  Neurological: She is alert and oriented to person, place, and time. She has normal reflexes.  Skin: Skin is warm and dry.  Psychiatric: She has a normal mood and affect. Her behavior is normal. Judgment and thought content normal.    No results found for: HGBA1C  Lab Results  Component Value Date   WBC 6.0 09/23/2014   HGB 10.4* 09/23/2014   HCT 34.3* 09/23/2014   PLT 358 08/25/2014   GLUCOSE 104* 08/25/2014   ALT 11 08/11/2014   AST 19 08/11/2014   NA 135 08/25/2014   K 3.2* 08/25/2014   CL 94* 08/25/2014   CREATININE 0.91 08/25/2014   BUN 11 08/25/2014   CO2 29 08/25/2014   TSH 3.632 08/14/2014   INR 2.6 07/18/2014    Ct Abdomen Pelvis W Contrast  08/11/2014   CLINICAL DATA:  RIGHT lower quadrant abdominal pain, feels like she has a mass in that area, 60 pound weight loss over 3 years, occult GI bleeding, hypertension, atrial fibrillation  EXAM: CT ABDOMEN AND PELVIS WITH CONTRAST  TECHNIQUE: Multidetector CT imaging of the abdomen and pelvis was performed using the standard protocol following bolus administration of intravenous contrast. Sagittal and coronal MPR images reconstructed from axial data set.  CONTRAST:  144mL OMNIPAQUE IOHEXOL 300 MG/ML SOLN IV. Dilute oral contrast.  COMPARISON:  None  FINDINGS: Small bibasilar pleural effusions and atelectasis.  Large area of absent enhancement within the spleen consistent with a large splenic infarct.  No splenic vein is identified though perisplenic collaterals are seen.  Small splenule adjacent to splenic hilum.  Atrophic pancreas with CBD dilatation and prior cholecystectomy.  Liver, kidneys, and adrenal glands normal.  Large mass at hepatic flexure of colon measuring 9.6 x 6.3 x 6.3 cm in size consistent with a colonic neoplasm.  Mild infiltrative changes of the fat surrounding the mass are seen which could be related to edema, ascites, or tumor extension.  Mass obstructs the  RIGHT colon, with dilatation of the ascending colon and cecum as well as of multiple small bowel loops.  Transverse colon decompressed.  Diverticulosis of descending and sigmoid colon without evidence of diverticulitis.  Moderate sized hiatal hernia.  Remainder of stomach unremarkable.  Atherosclerotic changes at aorta and coronary arteries.  No additional mass, adenopathy, or free air.  Minimal perihepatic and pelvic ascites.  Bones demineralized with scattered degenerative disc and facet disease changes of the thoracolumbar spine.  IMPRESSION: Large mass at hepatic flexure of colon measuring 9.6 x 6.3 x 6.3 cm in size consistent with a colonic neoplasm, causing colonic and small bowel obstruction.  Minimal peritumoral infiltration could be related to edema/ascites though tumor extension is not excluded.  Minimal ascites.  Atrophic pancreas.  Additionally, large splenic infarct with nonvisualization of the splenic vein and note of scattered collaterals in the perisplenic region.  Findings called to [employer] on 08/11/2014 at 1613 hours.  Findings also discussed with patient and her daughter.   Electronically Signed   By: Crist Infante.D.  On: 08/11/2014 16:37   Dg Chest Port 1 View  08/11/2014   CLINICAL DATA:  Low abdominal pain since October 2015, recent diagnosis of abdominal tumor. Loss of appetite, intermittent diarrhea and constipation. Weakness.  EXAM: PORTABLE CHEST - 1 VIEW  COMPARISON:  None.  FINDINGS: The cardiac silhouette appears mild to moderately enlarged. Mediastinal silhouette is nonsuspicious. Large hiatal hernia. Bold bibasilar strandy densities, mildly elevated LEFT hemidiaphragm. Skin folds project in LEFT chest. No pleural effusion or focal consolidation. Gaseous distended esophagus. Soft tissue planes and included osseous structures are nonsuspicious.  IMPRESSION: Mild to moderate cardiomegaly.  Bibasilar atelectasis.  Large hiatal hernia.   Electronically Signed   By: Elon Alas   On: 08/11/2014 23:17    Assessment & Plan:   Tenea was seen today for urinary tract infection and pain.  Diagnoses and all orders for this visit:  Urinary tract infection with hematuria, site unspecified Orders: -     Urine culture  Colon cancer  Diarrhea  Other orders -     HYDROcodone-acetaminophen (NORCO/VICODIN) 5-325 MG per tablet; Take 1-2 tablets by mouth every 4 (four) hours as needed for moderate pain or severe pain.   I have discontinued Ms. Fedie's levofloxacin. I am also having her maintain her primidone, Cholecalciferol, SANTYL, megestrol, ciprofloxacin, levothyroxine, mirtazapine, metoprolol succinate, and HYDROcodone-acetaminophen.  Meds ordered this encounter  Medications  . HYDROcodone-acetaminophen (NORCO/VICODIN) 5-325 MG per tablet    Sig: Take 1-2 tablets by mouth every 4 (four) hours as needed for moderate pain or severe pain.    Dispense:  30 tablet    Refill:  0     Follow-up: Return in about 3 months (around 02/10/2015), or if symptoms worsen or fail to improve.  Claretta Fraise, M.D.

## 2014-11-12 LAB — URINE CULTURE

## 2014-11-27 ENCOUNTER — Other Ambulatory Visit: Payer: Self-pay | Admitting: Family Medicine

## 2014-11-27 ENCOUNTER — Other Ambulatory Visit: Payer: Self-pay

## 2014-11-27 MED ORDER — HYDROCODONE-ACETAMINOPHEN 5-325 MG PO TABS: 1.0000 | ORAL_TABLET | ORAL | Status: DC | PRN

## 2014-11-27 NOTE — Telephone Encounter (Signed)
No problem. She has cancer of the colon. So keeping her comfortable is essential. I would be happy to write it for 60 or more pills at a time. For now I reprinted it at 60 tablets. And I can refill it as often as needed. They just have to let me know.

## 2014-11-27 NOTE — Telephone Encounter (Signed)
Stp's daughter and she is aware rx has been printed.

## 2014-11-27 NOTE — Telephone Encounter (Signed)
Pt aware written Rx is at front desk ready for pickup  

## 2014-11-27 NOTE — Telephone Encounter (Signed)
Last seen 11/10/14 Dr Livia Snellen  Last filled 11/10/14 #30   If approved print

## 2014-12-04 ENCOUNTER — Ambulatory Visit (INDEPENDENT_AMBULATORY_CARE_PROVIDER_SITE_OTHER): Payer: Medicare Other | Admitting: Family Medicine

## 2014-12-04 DIAGNOSIS — M6281 Muscle weakness (generalized): Secondary | ICD-10-CM | POA: Diagnosis not present

## 2014-12-04 DIAGNOSIS — J189 Pneumonia, unspecified organism: Secondary | ICD-10-CM | POA: Diagnosis not present

## 2014-12-04 DIAGNOSIS — D509 Iron deficiency anemia, unspecified: Secondary | ICD-10-CM | POA: Diagnosis not present

## 2014-12-04 DIAGNOSIS — I482 Chronic atrial fibrillation: Secondary | ICD-10-CM | POA: Diagnosis not present

## 2014-12-04 DIAGNOSIS — C182 Malignant neoplasm of ascending colon: Secondary | ICD-10-CM

## 2014-12-04 DIAGNOSIS — Z483 Aftercare following surgery for neoplasm: Secondary | ICD-10-CM | POA: Diagnosis not present

## 2014-12-11 ENCOUNTER — Other Ambulatory Visit: Payer: Self-pay

## 2014-12-11 MED ORDER — PREDNISONE 20 MG PO TABS: 20.0000 mg | ORAL_TABLET | Freq: Every day | ORAL | Status: AC

## 2014-12-22 ENCOUNTER — Other Ambulatory Visit: Payer: Self-pay | Admitting: Family Medicine

## 2014-12-24 ENCOUNTER — Telehealth: Payer: Self-pay | Admitting: *Deleted

## 2014-12-24 MED ORDER — FENTANYL 25 MCG/HR TD PT72: 25.0000 ug | MEDICATED_PATCH | TRANSDERMAL | Status: AC

## 2014-12-24 NOTE — Telephone Encounter (Signed)
Hospice nurse recommending fentanyl patch 12.5 or 25 mcg, pt does not like taking pills. If approved, write order and fax to [place] [phone] (write hospice patient on Rx). Notify me and i will let hospice know.

## 2014-12-24 NOTE — Telephone Encounter (Signed)
Due to terminal illness, this seems appropriate. Scrip printed.

## 2015-01-21 ENCOUNTER — Telehealth: Payer: Self-pay

## 2015-01-21 MED ORDER — MORPHINE SULFATE ER 30 MG PO TBCR: 30.0000 mg | EXTENDED_RELEASE_TABLET | Freq: Two times a day (BID) | ORAL | Status: AC

## 2015-01-21 NOTE — Telephone Encounter (Signed)
[name] please call in 1 month supply, #[phone].Thanks, WS

## 2015-01-21 NOTE — Telephone Encounter (Signed)
Fentanyl  Patches not working well with family having to come by to change every 3 days.  Can you change to MS Contin 30 mg q 12 hours  If so call into [place] and tell them its a Hospice patient

## 2015-01-21 NOTE — Telephone Encounter (Signed)
RX for MS Contin phoned in and faxed to [facility]

## 2015-01-28 ENCOUNTER — Telehealth: Payer: Self-pay | Admitting: *Deleted

## 2015-01-28 NOTE — Telephone Encounter (Signed)
FYI Pt transported to Hospice home today due to decline in health

## 2015-02-05 ENCOUNTER — Telehealth: Payer: Self-pay | Admitting: Family Medicine

## 2015-02-17 NOTE — Telephone Encounter (Signed)
ROUTED

## 2015-03-07 DEATH — deceased

## 2015-11-14 ENCOUNTER — Other Ambulatory Visit: Payer: Self-pay | Admitting: Nurse Practitioner

## 2016-02-11 IMAGING — CT CT ABD-PELV W/ CM
2 of 8 series · 15 of 46 positions shown, 17 images · IV contrast (Omnipaque 300)
Comparison: None

CLINICAL DATA: RIGHT lower quadrant abdominal pain, feels like she
has a mass in that area, 60 pound weight loss over 3 years, occult
GI bleeding, hypertension, atrial fibrillation

EXAM:
CT ABDOMEN AND PELVIS WITH CONTRAST
TECHNIQUE: Multidetector CT imaging of the abdomen and pelvis was performed
using the standard protocol following bolus administration of
intravenous contrast. Sagittal and coronal MPR images reconstructed
from axial data set.
CONTRAST:  100mL OMNIPAQUE IOHEXOL 300 MG/ML SOLN IV. Dilute oral
contrast.

[Series 2: abd_pel_with 5.0 b40f · axial · 0.66mm/px · z∈[-423,-58]mm · 12 of 85 slices shown, 14 images]
[im 6/85  soft-tissue]
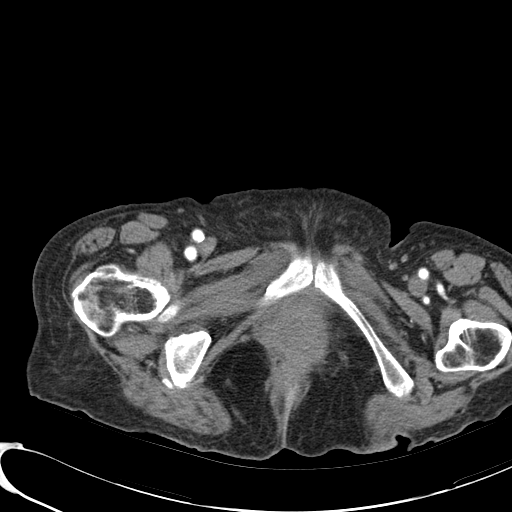
[im 6/85  bone]
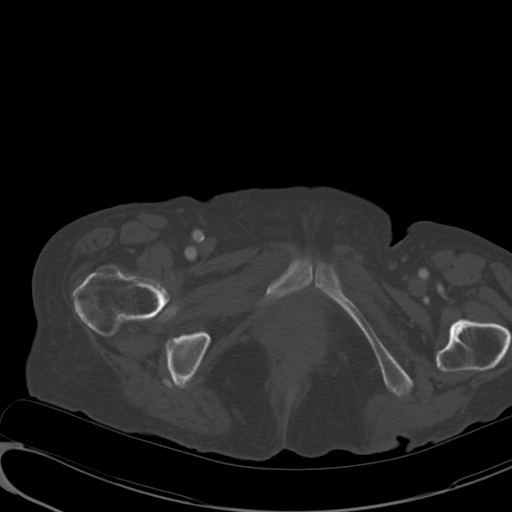
[im 11/85  soft-tissue]
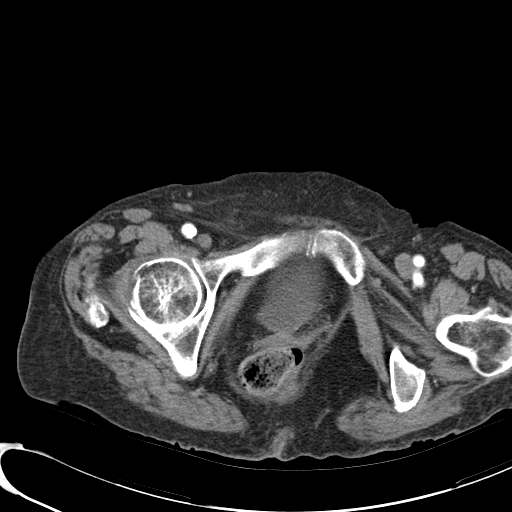
[im 22/85  soft-tissue]
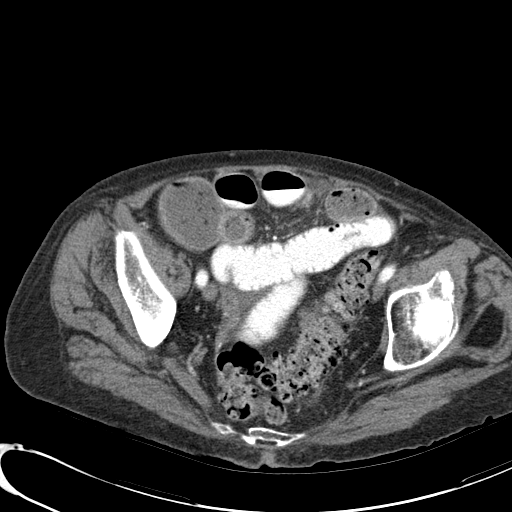
[im 27/85  soft-tissue]
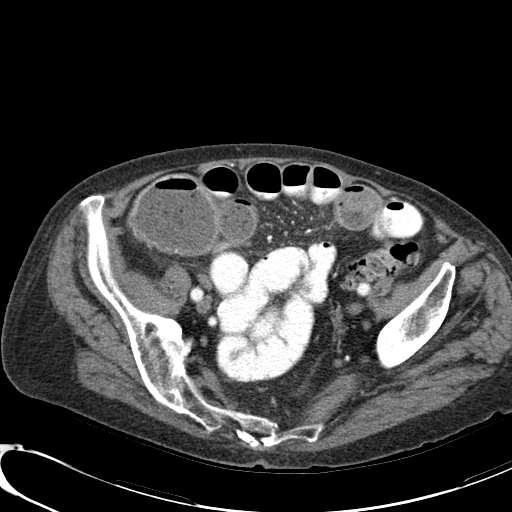
[im 32/85  soft-tissue]
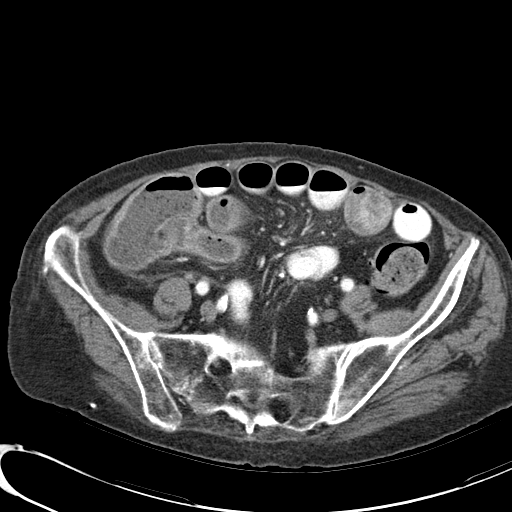
[im 37/85  soft-tissue]
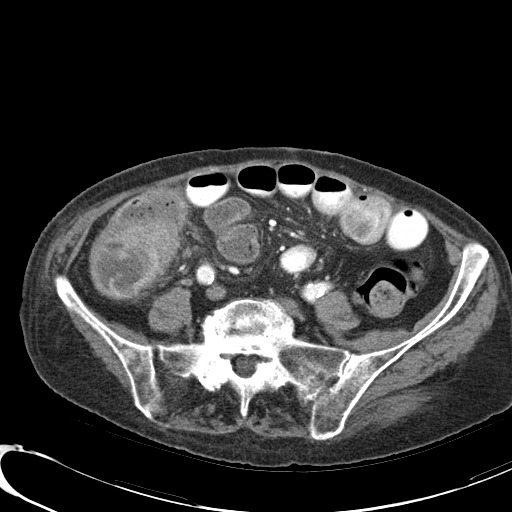
[im 48/85  soft-tissue]
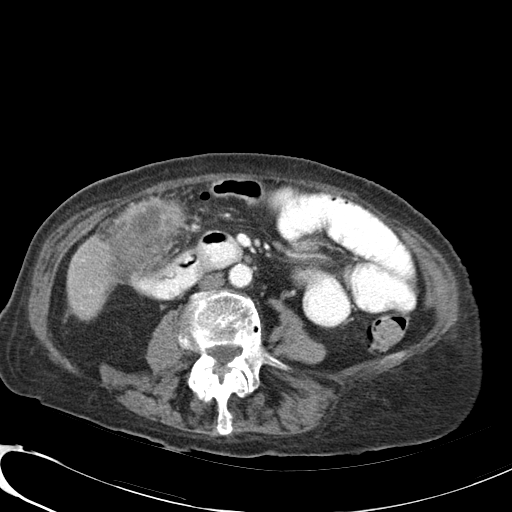
[im 53/85  soft-tissue]
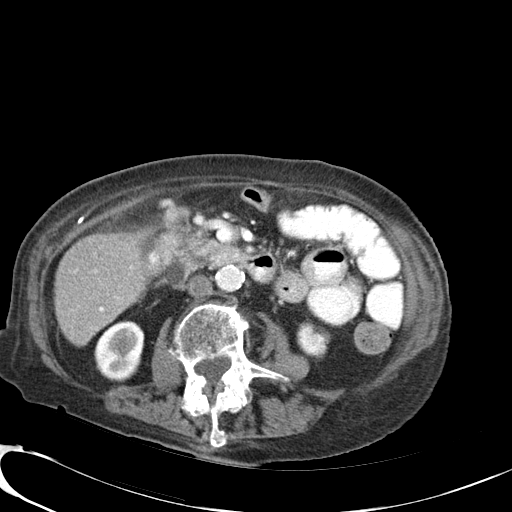
[im 58/85  soft-tissue]
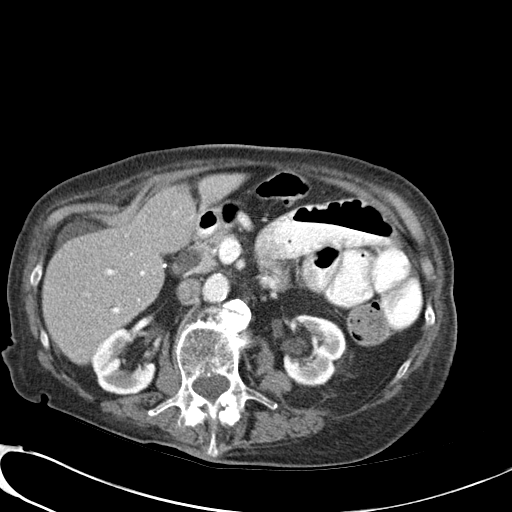
[im 58/85  bone]
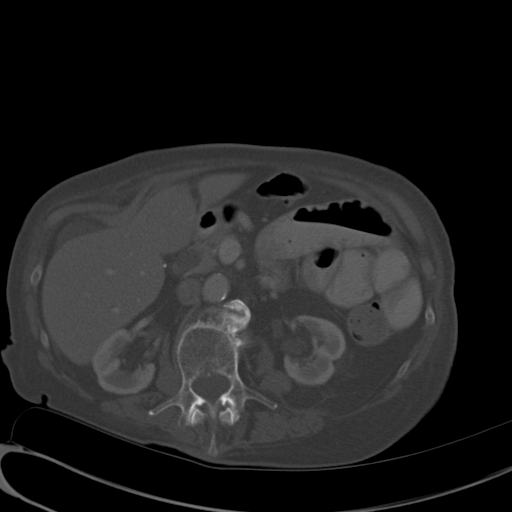
[im 64/85  soft-tissue]
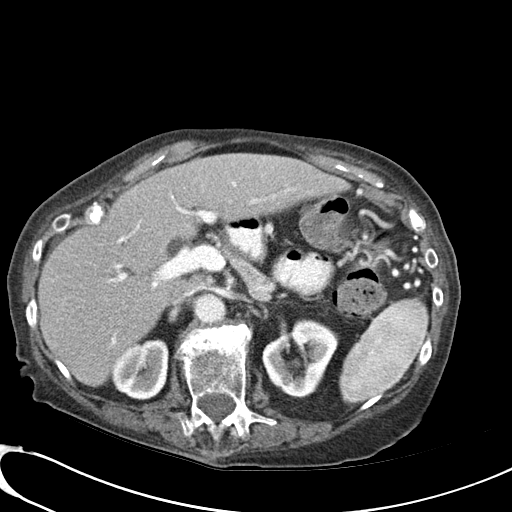
[im 74/85  soft-tissue]
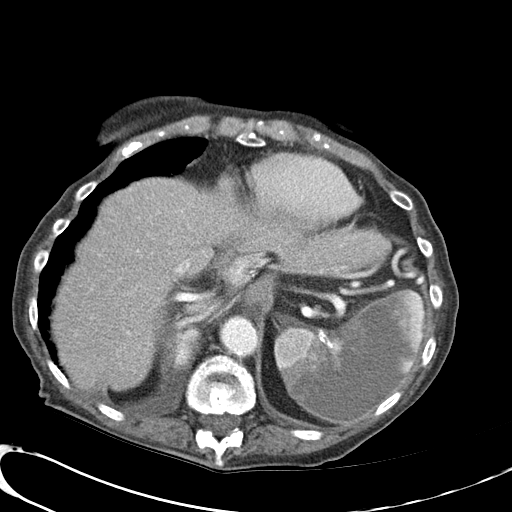
[im 79/85  soft-tissue]
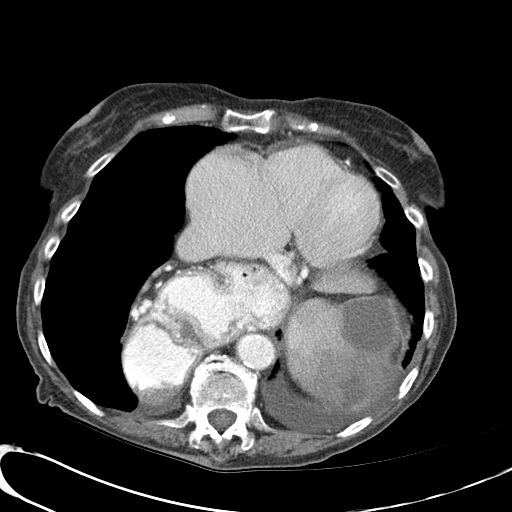

[Series 6: abd_pel_with 3.0 spo cor · coronal · 0.64mm/px · 3 of 77 slices shown]
[im 20/77  soft-tissue]
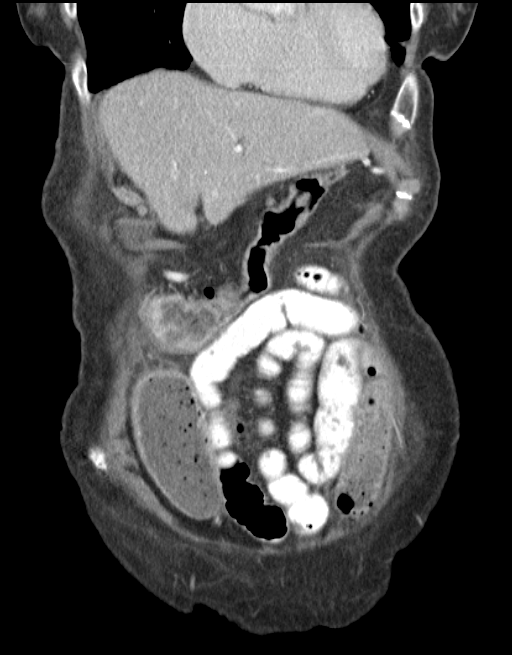
[im 39/77  soft-tissue]
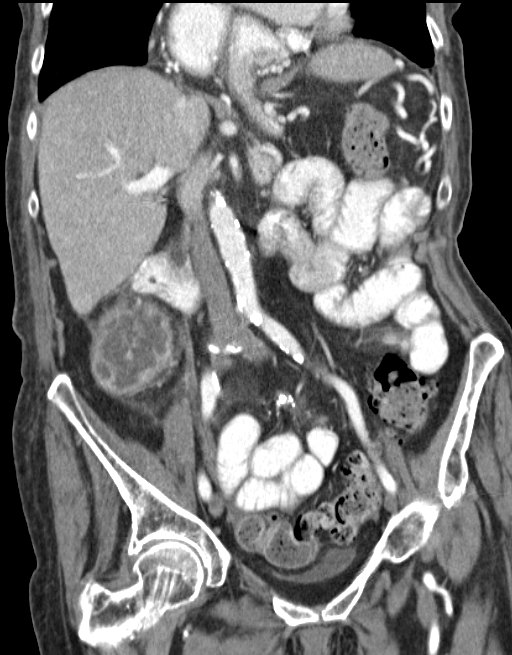
[im 58/77  soft-tissue]
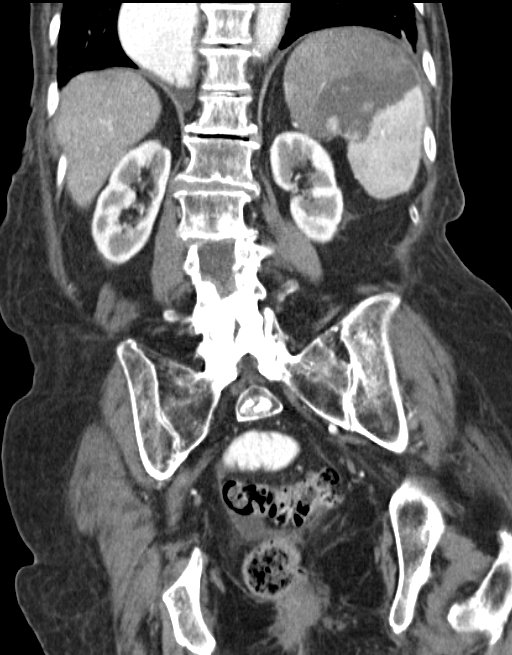

[15 of 46 positions shown; findings below may reference images not displayed]

FINDINGS: Small bibasilar pleural effusions and atelectasis.

Large area of absent enhancement within the spleen consistent with a
large splenic infarct.

No splenic vein is identified though perisplenic collaterals are
seen.

Small splenule adjacent to splenic hilum.

Atrophic pancreas with CBD dilatation and prior cholecystectomy.

Liver, kidneys, and adrenal glands normal.

Large mass at hepatic flexure of colon measuring 9.6 x 6.3 x 6.3 cm
in size consistent with a colonic neoplasm.

Mild infiltrative changes of the fat surrounding the mass are seen
which could be related to edema, ascites, or tumor extension.

Mass obstructs the RIGHT colon, with dilatation of the ascending
colon and cecum as well as of multiple small bowel loops.

Transverse colon decompressed.

Diverticulosis of descending and sigmoid colon without evidence of
diverticulitis.

Moderate sized hiatal hernia.

Remainder of stomach unremarkable.

Atherosclerotic changes at aorta and coronary arteries.

No additional mass, adenopathy, or free air.

Minimal perihepatic and pelvic ascites.

Bones demineralized with scattered degenerative disc and facet
disease changes of the thoracolumbar spine.
IMPRESSION: Large mass at hepatic flexure of colon measuring 9.6 x 6.3 x 6.3 cm
in size consistent with a colonic neoplasm, causing colonic and
small bowel obstruction.

Minimal peritumoral infiltration could be related to edema/ascites
though tumor extension is not excluded.

Minimal ascites.

Atrophic pancreas.

Additionally, large splenic infarct with nonvisualization of the
splenic vein and note of scattered collaterals in the perisplenic
region.

Findings called to [facility] on 08/11/2014 at 5151 hours.

Findings also discussed with patient and her daughter.

## 2016-02-17 IMAGING — CR DG ABD PORTABLE 1V
1 series · 1 of 1 positions shown · non-contrast
Comparison: Portable exam 4721 hr compared to 08/04/2014 and
correlated with CT abdomen and pelvis of 08/11/2014

CLINICAL DATA: Vomiting, colon cancer and hepatic flexure post
resection, postoperative day 3

EXAM:
PORTABLE ABDOMEN - 1 VIEW

[AP]
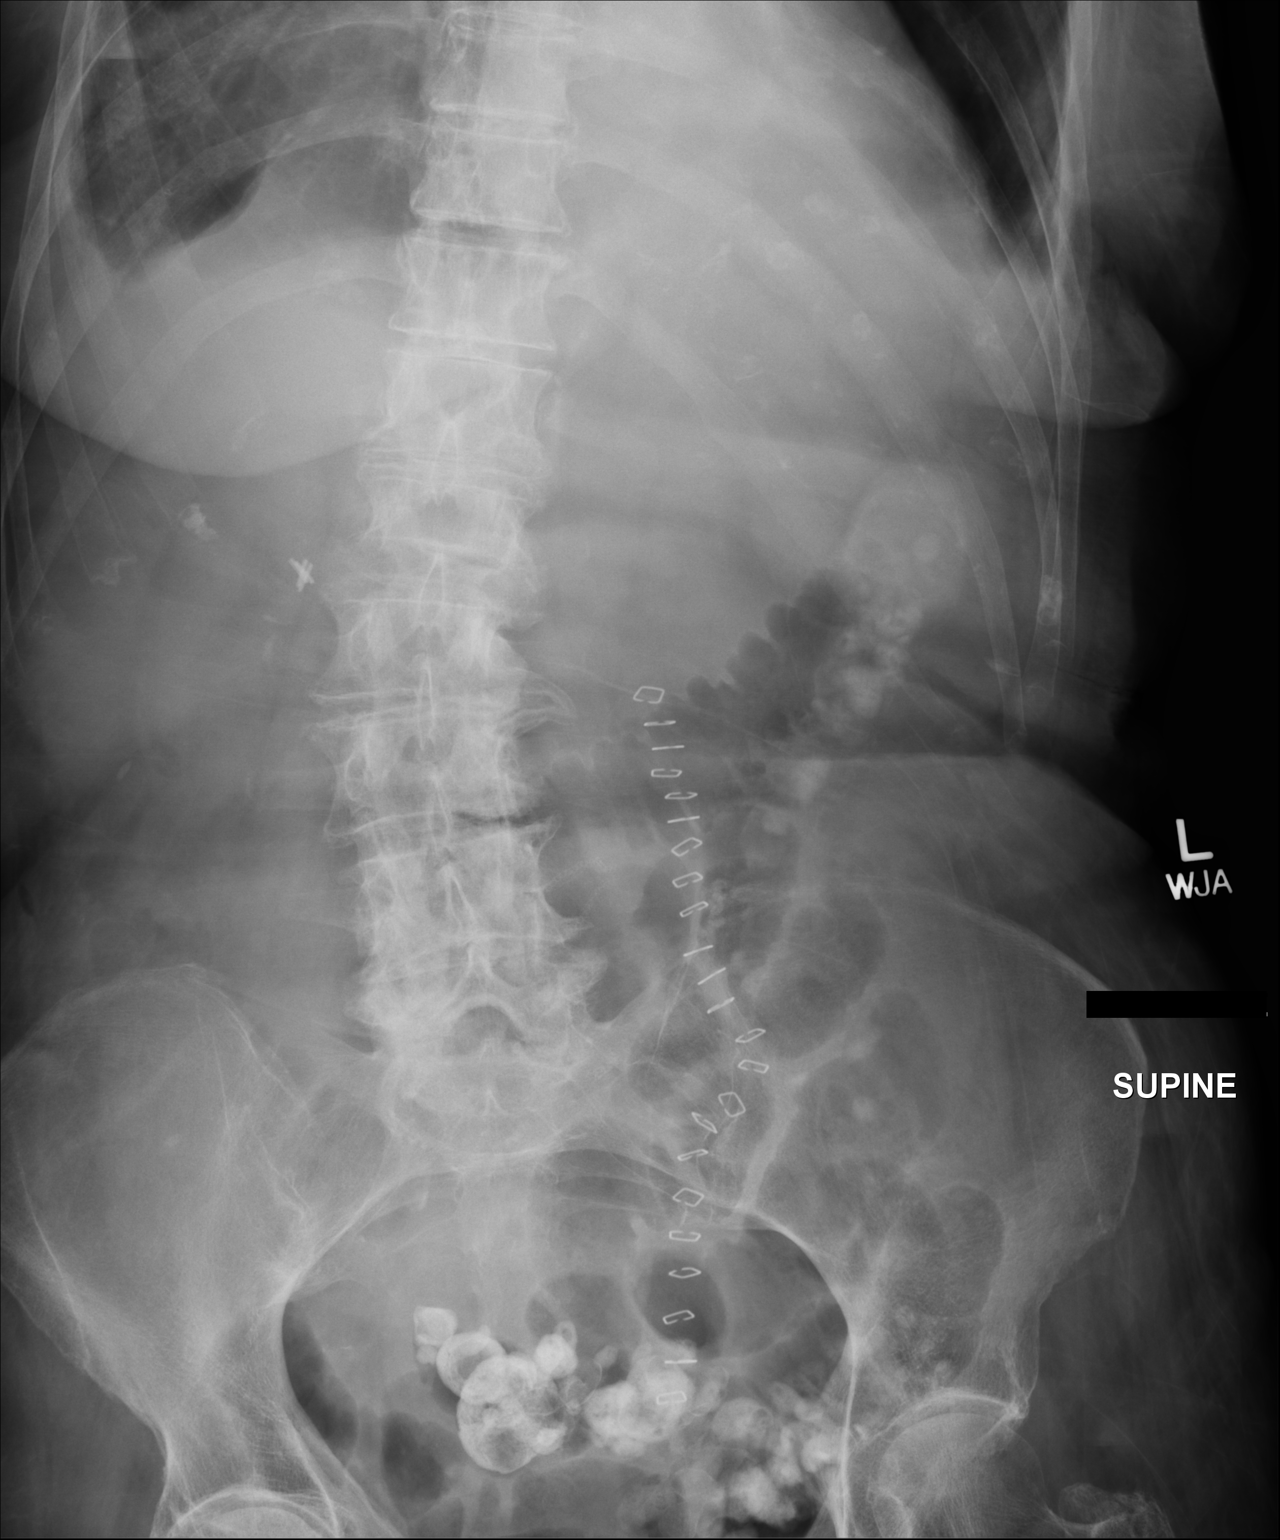

[1 of 1 positions shown; findings below may reference images not displayed]

FINDINGS: Ventral surgical clips from laparotomy.

Small amount retained contrast in distal colon and within colonic
diverticula.

Air-filled loops of small bowel likely reflecting postoperative
ileus.

No evidence of obstruction or wall thickening.

Bibasilar effusions and atelectasis.

Osseous demineralization with degenerative changes of the lumbar
spine with dextro convex scoliosis.
IMPRESSION: Suspected mild postoperative ileus.

## 2016-02-20 IMAGING — DX DG CHEST 2V
2 series · 2 of 2 positions shown · non-contrast
Comparison: August 18, [address] abdomen and pelvis
including lung bases August 11, 2014

CLINICAL DATA: Hypertension and atrial fibrillation

EXAM:
CHEST  2 VIEW

[chest pa]
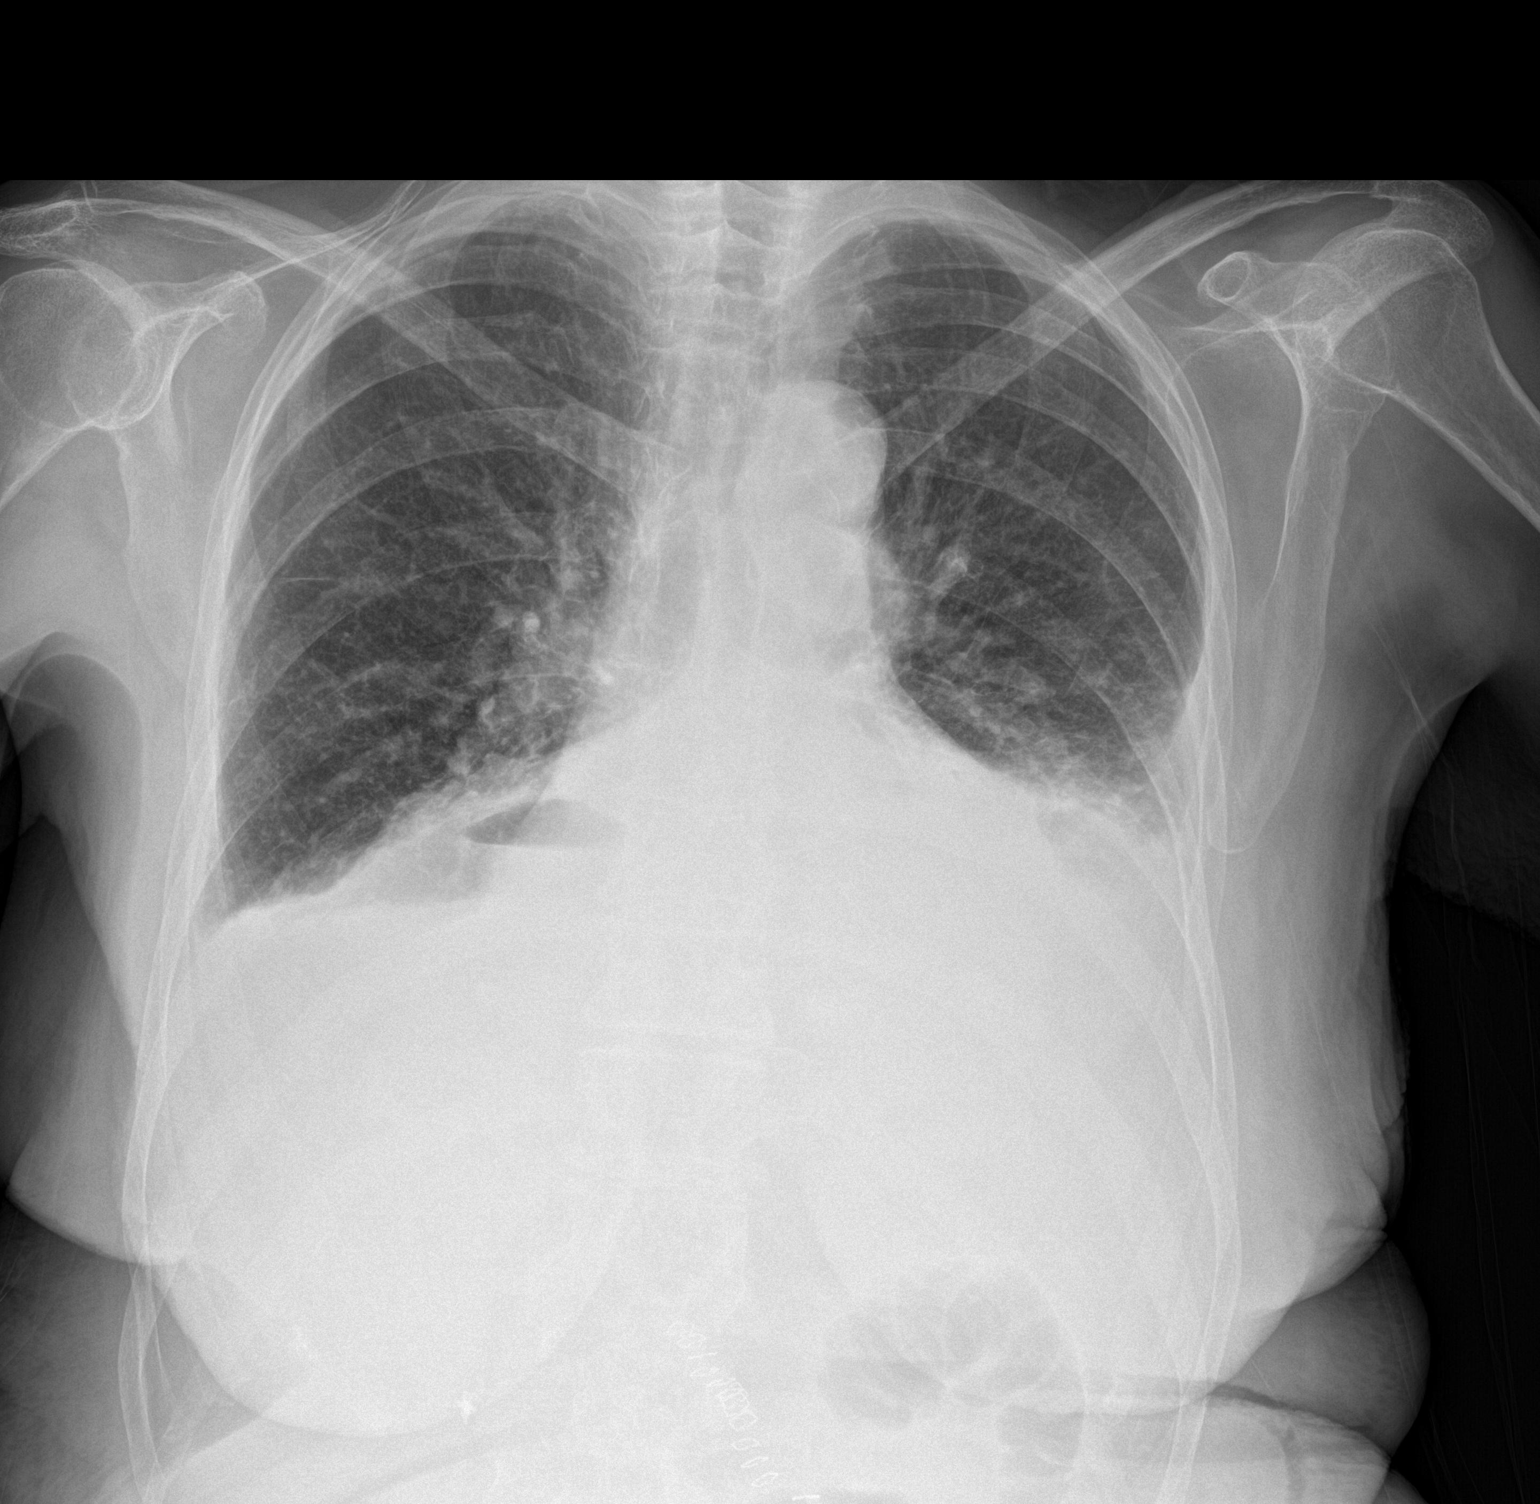

[chest lat]
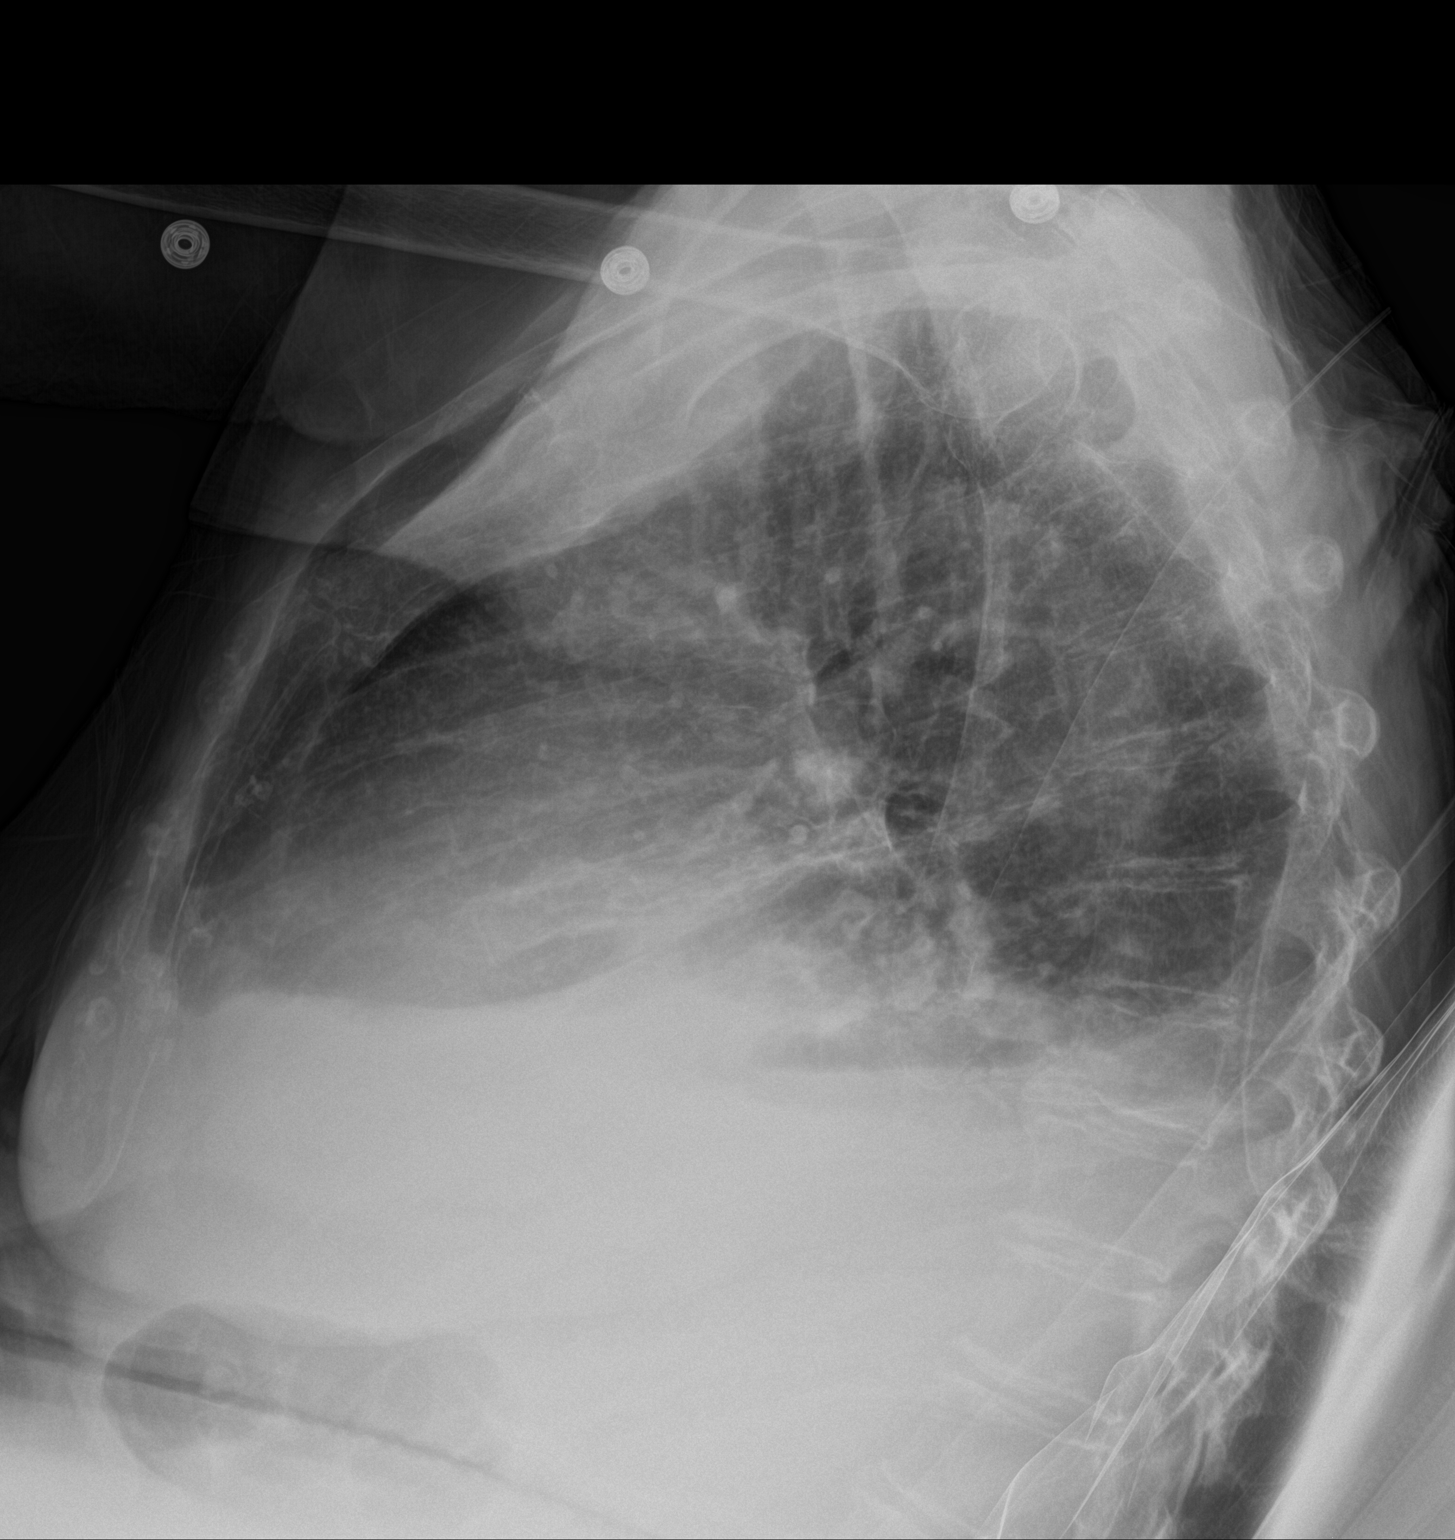

[2 of 2 positions shown; findings below may reference images not displayed]

FINDINGS: There are pleural effusions in each lung base with mild bibasilar
edema and atelectasis, slightly more on the left than on the right.
There is cardiomegaly. The pulmonary vascularity is within normal
limits. There is a large paraesophageal type hernia, better seen on
recent CT. Bones are osteoporotic. No adenopathy.
IMPRESSION: Findings felt to represent a degree of underlying congestive heart
failure. Atelectasis in each lung base. No frank airspace
consolidation. Paraesophageal type hernia, better seen on recent CT.
